# Patient Record
Sex: Female | Born: 1945 | Race: Black or African American | Hispanic: No | Marital: Single | Smoking: Never smoker
Health system: Southern US, Community
[De-identification: ages and names within clinical notes are randomized; demographics above are authoritative.]

## PROBLEM LIST (undated history)

## (undated) DIAGNOSIS — I10 Essential (primary) hypertension: Secondary | ICD-10-CM

## (undated) DIAGNOSIS — R51 Headache: Secondary | ICD-10-CM

## (undated) DIAGNOSIS — K219 Gastro-esophageal reflux disease without esophagitis: Secondary | ICD-10-CM

## (undated) DIAGNOSIS — M1990 Unspecified osteoarthritis, unspecified site: Secondary | ICD-10-CM

## (undated) HISTORY — PX: TONSILLECTOMY: SUR1361

## (undated) HISTORY — PX: ABDOMINAL HYSTERECTOMY: SHX81

---

## 2011-11-26 ENCOUNTER — Encounter (HOSPITAL_BASED_OUTPATIENT_CLINIC_OR_DEPARTMENT_OTHER): Payer: Self-pay | Admitting: Family Medicine

## 2011-11-26 ENCOUNTER — Emergency Department (HOSPITAL_BASED_OUTPATIENT_CLINIC_OR_DEPARTMENT_OTHER)
Admission: EM | Admit: 2011-11-26 | Discharge: 2011-11-26 | Disposition: A | Discharge status: Home / Self Care | Payer: BC Managed Care – PPO | Attending: Emergency Medicine | Admitting: Emergency Medicine

## 2011-11-26 ENCOUNTER — Emergency Department (HOSPITAL_BASED_OUTPATIENT_CLINIC_OR_DEPARTMENT_OTHER): Payer: BC Managed Care – PPO

## 2011-11-26 DIAGNOSIS — M79609 Pain in unspecified limb: Secondary | ICD-10-CM | POA: Insufficient documentation

## 2011-11-26 DIAGNOSIS — M7989 Other specified soft tissue disorders: Secondary | ICD-10-CM | POA: Insufficient documentation

## 2011-11-26 DIAGNOSIS — I10 Essential (primary) hypertension: Secondary | ICD-10-CM | POA: Insufficient documentation

## 2011-11-26 DIAGNOSIS — M79606 Pain in leg, unspecified: Secondary | ICD-10-CM

## 2011-11-26 HISTORY — DX: Essential (primary) hypertension: I10

## 2011-11-26 LAB — BASIC METABOLIC PANEL
BUN: 15 mg/dL (ref 6–23)
CO2: 28 mEq/L (ref 19–32)
Chloride: 103 mEq/L (ref 96–112)
Glucose, Bld: 100 mg/dL — ABNORMAL HIGH (ref 70–99)
Potassium: 3.4 mEq/L — ABNORMAL LOW (ref 3.5–5.1)

## 2011-11-26 MED ORDER — HYDROCODONE-ACETAMINOPHEN 5-500 MG PO TABS: 1.0000 | ORAL_TABLET | Freq: Four times a day (QID) | ORAL | Status: AC | PRN

## 2011-11-26 NOTE — ED Notes (Signed)
Brought urine to lab for hold

## 2011-11-26 NOTE — ED Provider Notes (Signed)
Medical screening examination/treatment/procedure(s) were performed by non-physician practitioner and as supervising physician I was immediately available for consultation/collaboration.  Ethelda Chick, MD 11/26/11 [phone]

## 2011-11-26 NOTE — ED Notes (Addendum)
Pt c/o right lower leg pain "for a year at least". Pt sts she saw PCP about same problem a year ago and had multiple tests. Pt sts pain is worse. Pt ambulatory. Pt drove self to ED.

## 2011-11-26 NOTE — Discharge Instructions (Signed)
Intermittent Claudication Blockage of leg arteries results from poor circulation of blood in the leg arteries. This produces an aching, tired, and sometimes burning pain in the legs that is brought on by exercise and made better by rest. Claudication refers to the limping that happens from leg cramps. It is also referred to as Vaso-occlusive disease of the legs, arterial insufficiency of the legs, recurrent leg pain, recurrent leg cramping and calf pain with exercise.  CAUSES  This condition is due to narrowing or blockage of the arteries (muscular vessels which carry blood away from the heart and around the body). Blockage of arteries can occur anywhere in the body. If they occur in the heart, a person may experience angina (chest pain) or even a heart attack. If they occur in the neck or the brain, a person may have a stroke. Intermittent claudication is when the blockage occurs in the legs, most commonly in the calf or the foot.   Atherosclerosis, or blockage of arteries, can occur for many reasons. Some of these are smoking, diabetes, and high cholesterol.  SYMPTOMS  Intermittent claudication may occur in both legs, and it often continues to get worse over time. However, some people complain only of weakness in the legs when walking, or a feeling of "tiredness" in the buttocks. Impotence (not able to have an erection) is an occasional complaint in men. Pain while resting is uncommon.  WHAT TO EXPECT AT [facility] PROVIDER'S OFFICE: Your medical history will be asked for and a physical examination will be performed. Medical history questions documenting claudication in detail may include:   Time pattern   Do you have leg cramps at night (nocturnal cramps)?   How often does leg pain with cramping occur?   Is it getting worse?   What is the quality of the pain?   Is the pain sharp?   Is there an aching pain with the cramps?   Aggravating factors   Is it worse after you exercise?     Is it worse after you are standing for a while?   Do you smoke? How much?   Do you drink alcohol? How much?   Are you diabetic? How well is your blood sugar controlled?   Other   What other symptoms are also present?   Has there been impotence (men)?   Is there pain in the back?   Is there a darkening of the skin of the legs, feet or toes?   Is there weakness or paralysis of the legs?  The physical examination may include evaluation of the femoral pulse (in the groin) and the other areas where the pulse can be felt in the legs. DIAGNOSIS  Diagnostic tests that may be performed include:  Blood pressure measured in arms and legs for comparison.   Doppler ultrasonography on the legs and the heart.   Duplex Doppler/ultrasound exam of extremity to visualize arterial blood flow.   ECG- to evaluate the activity of your heart.   Aortography- to visualize blockages in your arteries.  TREATMENT Surgical treatment may be suggested if claudication interferes with the patient's activities or work, and if the diseased arteries do not seem to be improving after treatment. Be aware that this condition can worsen over time and you should carefully monitor your condition. HOME CARE INSTRUCTIONS  Talk to your caregiver about the cause of your leg cramping and about what to do at home to relieve it.   A healthy diet is important to lessen  the likeliness of atherosclerosis.   A program of daily walking for short periods, and stopping for pain or cramping, may help improve function.   It is important to stop smoking.   Avoid putting hot or cold items on legs.   Avoid tight shoes.  SEEK MEDICAL CARE IF: There are many other causes of leg pain such as arthritis or low blood potassium. However, some causes of leg pain may be life threatening such as a blood clot in the legs. Seek medical attention if you have:  Leg pain that does not go away.   Legs that may be red, hot or swollen.    Ulcers or sores appear on your ankle or foot.   Any chest pain or shortness of breath accompanying leg pain.   Diabetes.   You are pregnant.  SEEK IMMEDIATE MEDICAL CARE IF:   Your leg pain becomes severe or will not go away.   Your foot turns blue or a dark color.   Your leg becomes red, hot or swollen or you develop a fever over 102F.   Any chest pain or shortness of breath accompanying leg pain.  MAKE SURE YOU:   Understand these instructions.   Will watch your condition.   Will get help right away if you are not doing well or get worse.  Document Released: 03/16/2004 Document Revised: 05/03/2011 Document Reviewed: 01/02/2008 [facility] Patient Information 2012 [place], [place].

## 2011-11-26 NOTE — ED Provider Notes (Signed)
History     CSN: [id number]  Arrival date & time 11/26/11  1553   First MD Initiated Contact with Patient 11/26/11 1616      Chief Complaint  Patient presents with  . Leg Pain    (Consider location/radiation/quality/duration/timing/severity/associated sxs/prior treatment) HPI Comments: Pt states that the pain is worse with standing for long periods:pt states that a year ago she had a test where see had "things hooked up to her abdomen and legs and the study came back normal"pt denies injury to the area or back pain:pt states that she has not been seen for it since the study:pt state that she takes over the counter medications without relief:pt states that she feels that sometimes it is warm to touch  Patient is a 66 y.o. female presenting with leg pain. The history is provided by the patient. No language interpreter was used.  Leg Pain  The incident occurred more than 1 week ago. There was no injury mechanism. Pain location: right lower leg. The quality of the pain is described as aching. The pain is mild. The pain has been constant since onset. Pertinent negatives include no numbness, no inability to bear weight, no loss of motion and no muscle weakness. She reports no foreign bodies present.    Past Medical History  Diagnosis Date  . Hypertension     History reviewed. No pertinent past surgical history.  No family history on file.  History  Substance Use Topics  . Smoking status: Never Smoker   . Smokeless tobacco: Not on file  . Alcohol Use: No    OB History    Grav Para Term Preterm Abortions TAB SAB Ect Mult Living                  Review of Systems  Constitutional: Negative.   Respiratory: Negative.   Cardiovascular: Negative.   Neurological: Negative for numbness.    Allergies  Review of patient's allergies indicates no known allergies.  Home Medications   Current Outpatient Rx  Name Route Sig Dispense Refill  . CLONIDINE HCL 0.2 MG PO TABS Oral Take  0.2 mg by mouth 2 (two) times daily.      BP 220/96  Pulse 69  Temp 98.3 F (36.8 C) (Oral)  Resp 20  Ht 5\' 5"  (1.651 m)  Wt 190 lb (86.183 kg)  BMI 31.62 kg/m2  SpO2 99%  Physical Exam  Nursing note and vitals reviewed. Constitutional: She is oriented to person, place, and time. She appears well-developed and well-nourished.  HENT:  Head: Normocephalic and atraumatic.  Eyes: Conjunctivae and EOM are normal.  Neck: Neck supple.  Cardiovascular: Normal rate and regular rhythm.   Pulmonary/Chest: Effort normal and breath sounds normal.  Musculoskeletal: Normal range of motion.       Mild swelling noted noted to right ankle and foot:no redness or warmth noted:pulses intact  Neurological: She is alert and oriented to person, place, and time.  Skin: Skin is warm and dry.    ED Course  Procedures (including critical care time)  Labs Reviewed  BASIC METABOLIC PANEL - Abnormal; Notable for the following:    Potassium 3.4 (*)     Glucose, Bld 100 (*)     All other components within normal limits   US Venous Img Lower Unilateral Right  11/26/2011  *RADIOLOGY REPORT*  Clinical Data: Leg pain  RIGHT LOWER EXTREMITY VENOUS DUPLEX ULTRASOUND  Technique:  Gray-scale sonography with graded compression, as well as color Doppler and  duplex ultrasound were performed to evaluate the deep venous system of the lower extremity from the level of the common femoral vein through the popliteal and proximal calf veins. Spectral Doppler was utilized to evaluate flow at rest and with distal augmentation maneuvers.  Comparison:  None.  Findings:  Normal compressibility of the common femoral, superficial femoral, and popliteal veins is demonstrated, as well as the visualized proximal calf veins.  No filling defects to suggest DVT on grayscale or color Doppler imaging.  Doppler waveforms show normal direction of venous flow, normal respiratory phasicity and response to augmentation.  IMPRESSION: No evidence of  lower extremity deep vein thrombosis.  Original Report Authenticated By: Rosealee Albee, M.D.     1. Leg pain       MDM  No sign of ZOX:WRUE treat pt symptomatically and have follow up with pcp        Teressa Lower, NP 11/26/11 1810

## 2011-11-27 NOTE — ED Notes (Signed)
Patient called requesting referral to neuro.  Reviewed MD notes where pt stated she was to f/u with her pcp.  Pt states she was discharged from her pcp.  Discussed referral to Dr. Rodena Medin and Neuro.  Patient stated she will follow up with [facility] and if she can not get in to see them, she will call back for referrals.

## 2013-07-15 ENCOUNTER — Other Ambulatory Visit (HOSPITAL_COMMUNITY): Payer: Self-pay | Admitting: Orthopaedic Surgery

## 2013-07-16 ENCOUNTER — Encounter (HOSPITAL_COMMUNITY): Payer: Self-pay | Admitting: Pharmacy Technician

## 2013-07-20 ENCOUNTER — Encounter (HOSPITAL_COMMUNITY)
Admission: RE | Admit: 2013-07-20 | Discharge: 2013-07-20 | Disposition: A | Discharge status: Home / Self Care | Payer: Medicare HMO | Source: Ambulatory Visit | Attending: Orthopaedic Surgery | Admitting: Orthopaedic Surgery

## 2013-07-20 ENCOUNTER — Ambulatory Visit (HOSPITAL_COMMUNITY)
Admission: RE | Admit: 2013-07-20 | Discharge: 2013-07-20 | Disposition: A | Discharge status: Home / Self Care | Payer: Medicare HMO | Source: Ambulatory Visit | Attending: Anesthesiology | Admitting: Anesthesiology

## 2013-07-20 ENCOUNTER — Encounter (HOSPITAL_COMMUNITY): Payer: Self-pay

## 2013-07-20 DIAGNOSIS — I10 Essential (primary) hypertension: Secondary | ICD-10-CM | POA: Insufficient documentation

## 2013-07-20 DIAGNOSIS — Z01818 Encounter for other preprocedural examination: Secondary | ICD-10-CM | POA: Insufficient documentation

## 2013-07-20 DIAGNOSIS — Z01810 Encounter for preprocedural cardiovascular examination: Secondary | ICD-10-CM | POA: Insufficient documentation

## 2013-07-20 DIAGNOSIS — Z01812 Encounter for preprocedural laboratory examination: Secondary | ICD-10-CM | POA: Insufficient documentation

## 2013-07-20 HISTORY — DX: Gastro-esophageal reflux disease without esophagitis: K21.9

## 2013-07-20 HISTORY — DX: Unspecified osteoarthritis, unspecified site: M19.90

## 2013-07-20 LAB — URINALYSIS, ROUTINE W REFLEX MICROSCOPIC
Bilirubin Urine: NEGATIVE
Glucose, UA: NEGATIVE mg/dL
Hgb urine dipstick: NEGATIVE
KETONES UR: NEGATIVE mg/dL
LEUKOCYTES UA: NEGATIVE
NITRITE: NEGATIVE
PROTEIN: NEGATIVE mg/dL
Specific Gravity, Urine: 1.015 (ref 1.005–1.030)
UROBILINOGEN UA: 0.2 mg/dL (ref 0.0–1.0)
pH: 8 (ref 5.0–8.0)

## 2013-07-20 LAB — BASIC METABOLIC PANEL
BUN: 12 mg/dL (ref 6–23)
CALCIUM: 9.5 mg/dL (ref 8.4–10.5)
CO2: 33 mEq/L — ABNORMAL HIGH (ref 19–32)
CREATININE: 0.82 mg/dL (ref 0.50–1.10)
Chloride: 101 mEq/L (ref 96–112)
GFR calc Af Amer: 85 mL/min — ABNORMAL LOW (ref >90)
GFR, EST NON AFRICAN AMERICAN: 73 mL/min — AB (ref >90)
GLUCOSE: 96 mg/dL (ref 70–99)
POTASSIUM: 3.5 meq/L — AB (ref 3.7–5.3)
Sodium: 144 mEq/L (ref 137–147)

## 2013-07-20 LAB — CBC
HCT: 35.9 % — ABNORMAL LOW (ref 36.0–46.0)
HEMOGLOBIN: 11.8 g/dL — AB (ref 12.0–15.0)
MCH: 31.1 pg (ref 26.0–34.0)
MCHC: 32.9 g/dL (ref 30.0–36.0)
MCV: 94.7 fL (ref 78.0–100.0)
Platelets: 246 10*3/uL (ref 150–400)
RBC: 3.79 MIL/uL — ABNORMAL LOW (ref 3.87–5.11)
RDW: 13.2 % (ref 11.5–15.5)
WBC: 7.9 10*3/uL (ref 4.0–10.5)

## 2013-07-20 LAB — SURGICAL PCR SCREEN
MRSA, PCR: NEGATIVE
Staphylococcus aureus: NEGATIVE

## 2013-07-20 LAB — TYPE AND SCREEN
ABO/RH(D): O POS
Antibody Screen: NEGATIVE

## 2013-07-20 LAB — ABO/RH: ABO/RH(D): O POS

## 2013-07-20 NOTE — Progress Notes (Signed)
Patient would like to sign the blood consent day of surgery.  Even tho there is an Air traffic controller"attestation" note in the orders, patient would like to hear the pro's & con's of having to receive blood.  Da

## 2013-07-20 NOTE — Pre-Procedure Instructions (Signed)
Autumn Lopez  07/20/2013   Your procedure is scheduled on:  Tuesday, March 3rd   [facility]  2 * 3 at  12:15 PM  Call this number if you have problems the morning of surgery: [phone]   Remember:   Do not eat food or drink liquids after midnight Monday.   Take these medicines the morning of surgery with A SIP OF WATER: Norvasc, Atenolol, Omeprazole, Pain Medication as needed,    Do not wear jewelry, make-up or nail polish.  Do not wear lotions, powders, or perfumes. You may NOT wear deodorant.  Do not shave underarms & legs 48 hours prior to surgery.    Do not bring valuables to the hospital.  [facility] is not responsible for any belongings or valuables.               Contacts, dentures or bridgework may not be worn into surgery.  Leave suitcase in the car. After surgery it may be brought to your room.  For patients admitted to the hospital, discharge time is determined by your treatment team.    Name and phone number of your driver:    Special Instructions: "Preparing for Surgery"   Please read over the following fact sheets that you were given: Pain Booklet, Blood Transfusion Information and Surgical Site Infection Prevention

## 2013-07-21 ENCOUNTER — Other Ambulatory Visit (HOSPITAL_COMMUNITY): Payer: BC Managed Care – PPO

## 2013-07-27 MED ORDER — CEFAZOLIN SODIUM-DEXTROSE 2-3 GM-% IV SOLR
2.0000 g | INTRAVENOUS | Status: AC
  Administered 2013-07-28: 2 g via INTRAVENOUS
  Filled 2013-07-27: qty 50

## 2013-07-27 NOTE — Progress Notes (Signed)
Patient notified to arrive at 1000.

## 2013-07-28 ENCOUNTER — Inpatient Hospital Stay (HOSPITAL_COMMUNITY): Payer: Medicare HMO

## 2013-07-28 ENCOUNTER — Encounter (HOSPITAL_COMMUNITY): Payer: Medicare HMO | Admitting: Anesthesiology

## 2013-07-28 ENCOUNTER — Encounter (HOSPITAL_COMMUNITY): Payer: Self-pay | Admitting: *Deleted

## 2013-07-28 ENCOUNTER — Inpatient Hospital Stay (HOSPITAL_COMMUNITY): Payer: Medicare HMO | Admitting: Anesthesiology

## 2013-07-28 ENCOUNTER — Encounter (HOSPITAL_COMMUNITY)
Admission: RE | Disposition: A | Discharge status: Home Health | Payer: Self-pay | Source: Ambulatory Visit | Attending: Orthopaedic Surgery

## 2013-07-28 ENCOUNTER — Inpatient Hospital Stay (HOSPITAL_COMMUNITY)
Admission: RE | Admit: 2013-07-28 | Discharge: 2013-07-31 | DRG: 470 | Disposition: A | Discharge status: Home Health | Payer: Medicare HMO | Source: Ambulatory Visit | Attending: Orthopaedic Surgery | Admitting: Orthopaedic Surgery

## 2013-07-28 DIAGNOSIS — M1610 Unilateral primary osteoarthritis, unspecified hip: Principal | ICD-10-CM | POA: Diagnosis present

## 2013-07-28 DIAGNOSIS — D62 Acute posthemorrhagic anemia: Secondary | ICD-10-CM | POA: Diagnosis not present

## 2013-07-28 DIAGNOSIS — M169 Osteoarthritis of hip, unspecified: Principal | ICD-10-CM | POA: Diagnosis present

## 2013-07-28 DIAGNOSIS — I10 Essential (primary) hypertension: Secondary | ICD-10-CM | POA: Diagnosis present

## 2013-07-28 DIAGNOSIS — M1612 Unilateral primary osteoarthritis, left hip: Secondary | ICD-10-CM

## 2013-07-28 DIAGNOSIS — Z96649 Presence of unspecified artificial hip joint: Secondary | ICD-10-CM

## 2013-07-28 DIAGNOSIS — K219 Gastro-esophageal reflux disease without esophagitis: Secondary | ICD-10-CM | POA: Diagnosis present

## 2013-07-28 DIAGNOSIS — Z7982 Long term (current) use of aspirin: Secondary | ICD-10-CM

## 2013-07-28 HISTORY — PX: TOTAL HIP ARTHROPLASTY: SHX124

## 2013-07-28 SURGERY — ARTHROPLASTY, HIP, TOTAL, ANTERIOR APPROACH
Anesthesia: General | Laterality: Left

## 2013-07-28 MED ORDER — NEOSTIGMINE METHYLSULFATE 1 MG/ML IJ SOLN
INTRAMUSCULAR | Status: AC
  Filled 2013-07-28: qty 10

## 2013-07-28 MED ORDER — EPHEDRINE SULFATE 50 MG/ML IJ SOLN
INTRAMUSCULAR | Status: AC
  Filled 2013-07-28: qty 1

## 2013-07-28 MED ORDER — EPHEDRINE SULFATE 50 MG/ML IJ SOLN
INTRAMUSCULAR | Status: DC | PRN
  Administered 2013-07-28: 10 mg via INTRAVENOUS
  Administered 2013-07-28: 15 mg via INTRAVENOUS

## 2013-07-28 MED ORDER — PHENOL 1.4 % MT LIQD: 1.0000 | OROMUCOSAL | Status: DC | PRN

## 2013-07-28 MED ORDER — ONDANSETRON HCL 4 MG/2ML IJ SOLN
INTRAMUSCULAR | Status: DC | PRN
  Administered 2013-07-28: 4 mg via INTRAVENOUS

## 2013-07-28 MED ORDER — GLYCOPYRROLATE 0.2 MG/ML IJ SOLN
INTRAMUSCULAR | Status: DC | PRN
  Administered 2013-07-28: 0.1 mg via INTRAVENOUS
  Administered 2013-07-28: .8 mg via INTRAVENOUS
  Administered 2013-07-28: 0.1 mg via INTRAVENOUS

## 2013-07-28 MED ORDER — MORPHINE SULFATE 15 MG PO TABS
30.0000 mg | ORAL_TABLET | ORAL | Status: DC | PRN
  Administered 2013-07-28 – 2013-07-30 (×6): 30 mg via ORAL
  Filled 2013-07-28 (×6): qty 2

## 2013-07-28 MED ORDER — OXYCODONE HCL 5 MG/5ML PO SOLN: 5.0000 mg | Freq: Once | ORAL | Status: DC | PRN

## 2013-07-28 MED ORDER — SODIUM CHLORIDE 0.9 % IV SOLN
INTRAVENOUS | Status: DC
  Administered 2013-07-28: 75 mL/h via INTRAVENOUS
  Administered 2013-07-29: 10 mL/h via INTRAVENOUS

## 2013-07-28 MED ORDER — CEFAZOLIN SODIUM 1-5 GM-% IV SOLN
1.0000 g | Freq: Four times a day (QID) | INTRAVENOUS | Status: AC
  Administered 2013-07-28 (×2): 1 g via INTRAVENOUS
  Filled 2013-07-28 (×2): qty 50

## 2013-07-28 MED ORDER — HYDROMORPHONE HCL PF 1 MG/ML IJ SOLN
1.0000 mg | INTRAMUSCULAR | Status: DC | PRN
  Administered 2013-07-28 – 2013-07-30 (×4): 1 mg via INTRAVENOUS
  Filled 2013-07-28 (×4): qty 1

## 2013-07-28 MED ORDER — PROPOFOL 10 MG/ML IV BOLUS
INTRAVENOUS | Status: AC
  Filled 2013-07-28: qty 20

## 2013-07-28 MED ORDER — DEXAMETHASONE SODIUM PHOSPHATE 4 MG/ML IJ SOLN
INTRAMUSCULAR | Status: AC
  Filled 2013-07-28: qty 2

## 2013-07-28 MED ORDER — PNEUMOCOCCAL VAC POLYVALENT 25 MCG/0.5ML IJ INJ
0.5000 mL | INJECTION | INTRAMUSCULAR | Status: AC
  Administered 2013-07-30: 0.5 mL via INTRAMUSCULAR
  Filled 2013-07-28: qty 0.5

## 2013-07-28 MED ORDER — GLYCOPYRROLATE 0.2 MG/ML IJ SOLN
INTRAMUSCULAR | Status: AC
  Filled 2013-07-28: qty 1

## 2013-07-28 MED ORDER — LISINOPRIL 40 MG PO TABS
40.0000 mg | ORAL_TABLET | Freq: Every day | ORAL | Status: DC
  Administered 2013-07-29 – 2013-07-31 (×3): 40 mg via ORAL
  Filled 2013-07-28 (×3): qty 1

## 2013-07-28 MED ORDER — ATENOLOL 50 MG PO TABS
50.0000 mg | ORAL_TABLET | Freq: Every day | ORAL | Status: DC
  Administered 2013-07-29 – 2013-07-31 (×3): 50 mg via ORAL
  Filled 2013-07-28 (×3): qty 1

## 2013-07-28 MED ORDER — METOCLOPRAMIDE HCL 5 MG/ML IJ SOLN
5.0000 mg | Freq: Three times a day (TID) | INTRAMUSCULAR | Status: DC | PRN
Start: 2013-07-28 — End: 2013-07-31

## 2013-07-28 MED ORDER — ALUM & MAG HYDROXIDE-SIMETH 200-200-20 MG/5ML PO SUSP: 30.0000 mL | ORAL | Status: DC | PRN

## 2013-07-28 MED ORDER — MIDAZOLAM HCL 2 MG/2ML IJ SOLN
INTRAMUSCULAR | Status: AC
  Filled 2013-07-28: qty 2

## 2013-07-28 MED ORDER — ASPIRIN EC 325 MG PO TBEC
325.0000 mg | DELAYED_RELEASE_TABLET | Freq: Two times a day (BID) | ORAL | Status: DC
  Administered 2013-07-29 – 2013-07-31 (×4): 325 mg via ORAL
  Filled 2013-07-28 (×8): qty 1

## 2013-07-28 MED ORDER — ROCURONIUM BROMIDE 100 MG/10ML IV SOLN
INTRAVENOUS | Status: DC | PRN
  Administered 2013-07-28: 50 mg via INTRAVENOUS

## 2013-07-28 MED ORDER — PHENYLEPHRINE 40 MCG/ML (10ML) SYRINGE FOR IV PUSH (FOR BLOOD PRESSURE SUPPORT)
PREFILLED_SYRINGE | INTRAVENOUS | Status: AC
  Filled 2013-07-28: qty 10

## 2013-07-28 MED ORDER — LIDOCAINE HCL (CARDIAC) 20 MG/ML IV SOLN
INTRAVENOUS | Status: AC
  Filled 2013-07-28: qty 5

## 2013-07-28 MED ORDER — GLYCOPYRROLATE 0.2 MG/ML IJ SOLN
INTRAMUSCULAR | Status: AC
  Filled 2013-07-28: qty 3

## 2013-07-28 MED ORDER — ACETAMINOPHEN 650 MG RE SUPP: 650.0000 mg | Freq: Four times a day (QID) | RECTAL | Status: DC | PRN

## 2013-07-28 MED ORDER — OXYCODONE HCL 5 MG PO TABS
5.0000 mg | ORAL_TABLET | ORAL | Status: DC | PRN
  Administered 2013-07-30 – 2013-07-31 (×5): 10 mg via ORAL
  Filled 2013-07-28 (×6): qty 2

## 2013-07-28 MED ORDER — SODIUM CHLORIDE 0.9 % IR SOLN
Status: DC | PRN
  Administered 2013-07-28 (×2): 1000 mL

## 2013-07-28 MED ORDER — ONDANSETRON HCL 4 MG PO TABS: 4.0000 mg | ORAL_TABLET | Freq: Four times a day (QID) | ORAL | Status: DC | PRN

## 2013-07-28 MED ORDER — TRANEXAMIC ACID 100 MG/ML IV SOLN
1000.0000 mg | INTRAVENOUS | Status: AC
  Administered 2013-07-28: 1000 mg via INTRAVENOUS
  Filled 2013-07-28: qty 10

## 2013-07-28 MED ORDER — INFLUENZA VAC SPLIT QUAD 0.5 ML IM SUSP
0.5000 mL | INTRAMUSCULAR | Status: AC
  Administered 2013-07-30: 0.5 mL via INTRAMUSCULAR
  Filled 2013-07-28: qty 0.5

## 2013-07-28 MED ORDER — DEXAMETHASONE SODIUM PHOSPHATE 4 MG/ML IJ SOLN
INTRAMUSCULAR | Status: DC | PRN
  Administered 2013-07-28: 8 mg via INTRAVENOUS

## 2013-07-28 MED ORDER — ONDANSETRON HCL 4 MG/2ML IJ SOLN: 4.0000 mg | Freq: Four times a day (QID) | INTRAMUSCULAR | Status: DC | PRN

## 2013-07-28 MED ORDER — STERILE WATER FOR INJECTION IJ SOLN
INTRAMUSCULAR | Status: AC
  Filled 2013-07-28: qty 10

## 2013-07-28 MED ORDER — MORPHINE SULFATE 30 MG PO TABS: 30.0000 mg | ORAL_TABLET | ORAL | Status: DC | PRN

## 2013-07-28 MED ORDER — OXYCODONE HCL 5 MG PO TABS: 5.0000 mg | ORAL_TABLET | Freq: Once | ORAL | Status: DC | PRN

## 2013-07-28 MED ORDER — ONDANSETRON HCL 4 MG/2ML IJ SOLN
INTRAMUSCULAR | Status: AC
  Filled 2013-07-28: qty 2

## 2013-07-28 MED ORDER — ARTIFICIAL TEARS OP OINT
TOPICAL_OINTMENT | OPHTHALMIC | Status: DC | PRN
  Administered 2013-07-28: 1 via OPHTHALMIC

## 2013-07-28 MED ORDER — FENTANYL CITRATE 0.05 MG/ML IJ SOLN
INTRAMUSCULAR | Status: AC
  Filled 2013-07-28: qty 5

## 2013-07-28 MED ORDER — HYDROMORPHONE HCL PF 1 MG/ML IJ SOLN
0.2500 mg | INTRAMUSCULAR | Status: DC | PRN
  Administered 2013-07-28 (×2): 0.5 mg via INTRAVENOUS

## 2013-07-28 MED ORDER — AMLODIPINE BESYLATE 10 MG PO TABS
10.0000 mg | ORAL_TABLET | Freq: Every day | ORAL | Status: DC
  Administered 2013-07-29 – 2013-07-31 (×3): 10 mg via ORAL
  Filled 2013-07-28 (×3): qty 1

## 2013-07-28 MED ORDER — PANTOPRAZOLE SODIUM 40 MG PO TBEC
40.0000 mg | DELAYED_RELEASE_TABLET | Freq: Every day | ORAL | Status: DC
  Administered 2013-07-29 – 2013-07-30 (×2): 40 mg via ORAL
  Filled 2013-07-28 (×2): qty 1

## 2013-07-28 MED ORDER — NEOSTIGMINE METHYLSULFATE 1 MG/ML IJ SOLN
INTRAMUSCULAR | Status: DC | PRN
  Administered 2013-07-28: 5 mg via INTRAVENOUS

## 2013-07-28 MED ORDER — LACTATED RINGERS IV SOLN
INTRAVENOUS | Status: DC | PRN
  Administered 2013-07-28 (×2): via INTRAVENOUS

## 2013-07-28 MED ORDER — FENTANYL CITRATE 0.05 MG/ML IJ SOLN
INTRAMUSCULAR | Status: DC | PRN
  Administered 2013-07-28: 25 ug via INTRAVENOUS
  Administered 2013-07-28 (×4): 50 ug via INTRAVENOUS
  Administered 2013-07-28: 25 ug via INTRAVENOUS

## 2013-07-28 MED ORDER — LIDOCAINE HCL (CARDIAC) 20 MG/ML IV SOLN
INTRAVENOUS | Status: DC | PRN
  Administered 2013-07-28: 100 mg via INTRAVENOUS

## 2013-07-28 MED ORDER — MENTHOL 3 MG MT LOZG
1.0000 | LOZENGE | OROMUCOSAL | Status: DC | PRN
  Administered 2013-07-28: 3 mg via ORAL
  Filled 2013-07-28 (×2): qty 9

## 2013-07-28 MED ORDER — ARTIFICIAL TEARS OP OINT
TOPICAL_OINTMENT | OPHTHALMIC | Status: AC
  Filled 2013-07-28: qty 3.5

## 2013-07-28 MED ORDER — LACTATED RINGERS IV SOLN
INTRAVENOUS | Status: DC
  Administered 2013-07-28: 11:00:00 via INTRAVENOUS

## 2013-07-28 MED ORDER — METOCLOPRAMIDE HCL 5 MG PO TABS
5.0000 mg | ORAL_TABLET | Freq: Three times a day (TID) | ORAL | Status: DC | PRN
  Filled 2013-07-28: qty 2

## 2013-07-28 MED ORDER — ZOLPIDEM TARTRATE 10 MG PO TABS: 5.0000 mg | ORAL_TABLET | Freq: Every day | ORAL | Status: DC

## 2013-07-28 MED ORDER — ZOLPIDEM TARTRATE 5 MG PO TABS
5.0000 mg | ORAL_TABLET | Freq: Every day | ORAL | Status: DC
  Administered 2013-07-28 – 2013-07-30 (×3): 5 mg via ORAL
  Filled 2013-07-28 (×3): qty 1

## 2013-07-28 MED ORDER — HYDROMORPHONE HCL PF 1 MG/ML IJ SOLN
INTRAMUSCULAR | Status: AC
  Filled 2013-07-28: qty 1

## 2013-07-28 MED ORDER — MIDAZOLAM HCL 5 MG/5ML IJ SOLN
INTRAMUSCULAR | Status: DC | PRN
  Administered 2013-07-28 (×2): 1 mg via INTRAVENOUS

## 2013-07-28 MED ORDER — ROCURONIUM BROMIDE 50 MG/5ML IV SOLN
INTRAVENOUS | Status: AC
  Filled 2013-07-28: qty 1

## 2013-07-28 MED ORDER — ACETAMINOPHEN 325 MG PO TABS: 650.0000 mg | ORAL_TABLET | Freq: Four times a day (QID) | ORAL | Status: DC | PRN

## 2013-07-28 MED ORDER — PROPOFOL 10 MG/ML IV BOLUS
INTRAVENOUS | Status: DC | PRN
  Administered 2013-07-28: 150 mg via INTRAVENOUS
  Administered 2013-07-28: 50 mg via INTRAVENOUS

## 2013-07-28 MED ORDER — METOCLOPRAMIDE HCL 5 MG/ML IJ SOLN: 10.0000 mg | Freq: Once | INTRAMUSCULAR | Status: DC | PRN

## 2013-07-28 SURGICAL SUPPLY — 53 items
BANDAGE GAUZE ELAST BULKY 4 IN (GAUZE/BANDAGES/DRESSINGS) IMPLANT
BENZOIN TINCTURE PRP APPL 2/3 (GAUZE/BANDAGES/DRESSINGS) ×3 IMPLANT
BLADE SAW SGTL 18X1.27X75 (BLADE) ×2 IMPLANT
BLADE SAW SGTL 18X1.27X75MM (BLADE) ×1
BLADE SURG ROTATE 9660 (MISCELLANEOUS) IMPLANT
CAPT HIP PF COP ×3 IMPLANT
CELLS DAT CNTRL 66122 CELL SVR (MISCELLANEOUS) ×1 IMPLANT
CLOSURE WOUND 1/2 X4 (GAUZE/BANDAGES/DRESSINGS) ×1
COVER BACK TABLE 24X17X13 BIG (DRAPES) IMPLANT
COVER SURGICAL LIGHT HANDLE (MISCELLANEOUS) ×3 IMPLANT
DERMABOND ADHESIVE PROPEN (GAUZE/BANDAGES/DRESSINGS) ×2
DERMABOND ADVANCED .7 DNX6 (GAUZE/BANDAGES/DRESSINGS) ×1 IMPLANT
DRAPE C-ARM 42X72 X-RAY (DRAPES) ×3 IMPLANT
DRAPE STERI IOBAN 125X83 (DRAPES) ×3 IMPLANT
DRAPE U-SHAPE 47X51 STRL (DRAPES) ×9 IMPLANT
DRSG AQUACEL AG ADV 3.5X10 (GAUZE/BANDAGES/DRESSINGS) ×3 IMPLANT
DURAPREP 26ML APPLICATOR (WOUND CARE) ×3 IMPLANT
ELECT BLADE 4.0 EZ CLEAN MEGAD (MISCELLANEOUS)
ELECT BLADE 6.5 EXT (BLADE) IMPLANT
ELECT CAUTERY BLADE 6.4 (BLADE) ×3 IMPLANT
ELECT REM PT RETURN 9FT ADLT (ELECTROSURGICAL) ×3
ELECTRODE BLDE 4.0 EZ CLN MEGD (MISCELLANEOUS) IMPLANT
ELECTRODE REM PT RTRN 9FT ADLT (ELECTROSURGICAL) ×1 IMPLANT
FACESHIELD LNG OPTICON STERILE (SAFETY) ×6 IMPLANT
GLOVE BIOGEL PI IND STRL 8 (GLOVE) ×2 IMPLANT
GLOVE BIOGEL PI INDICATOR 8 (GLOVE) ×4
GLOVE ECLIPSE 8.0 STRL XLNG CF (GLOVE) ×3 IMPLANT
GLOVE ORTHO TXT STRL SZ7.5 (GLOVE) ×6 IMPLANT
GOWN STRL REIN XL XLG (GOWN DISPOSABLE) ×6 IMPLANT
HANDPIECE INTERPULSE COAX TIP (DISPOSABLE) ×2
KIT BASIN OR (CUSTOM PROCEDURE TRAY) ×3 IMPLANT
KIT ROOM TURNOVER OR (KITS) ×3 IMPLANT
MANIFOLD NEPTUNE II (INSTRUMENTS) ×3 IMPLANT
NS IRRIG 1000ML POUR BTL (IV SOLUTION) ×3 IMPLANT
PACK TOTAL JOINT (CUSTOM PROCEDURE TRAY) ×3 IMPLANT
PAD ARMBOARD 7.5X6 YLW CONV (MISCELLANEOUS) ×6 IMPLANT
RTRCTR WOUND ALEXIS 18CM MED (MISCELLANEOUS) ×3
SET HNDPC FAN SPRY TIP SCT (DISPOSABLE) ×1 IMPLANT
SPONGE LAP 18X18 X RAY DECT (DISPOSABLE) IMPLANT
SPONGE LAP 4X18 X RAY DECT (DISPOSABLE) IMPLANT
STRIP CLOSURE SKIN 1/2X4 (GAUZE/BANDAGES/DRESSINGS) ×2 IMPLANT
SUT ETHIBOND NAB CT1 #1 30IN (SUTURE) ×3 IMPLANT
SUT MNCRL AB 4-0 PS2 18 (SUTURE) ×3 IMPLANT
SUT VIC AB 0 CT1 27 (SUTURE) ×2
SUT VIC AB 0 CT1 27XBRD ANBCTR (SUTURE) ×1 IMPLANT
SUT VIC AB 1 CT1 27 (SUTURE) ×2
SUT VIC AB 1 CT1 27XBRD ANBCTR (SUTURE) ×1 IMPLANT
SUT VIC AB 2-0 CT1 27 (SUTURE) ×4
SUT VIC AB 2-0 CT1 TAPERPNT 27 (SUTURE) ×2 IMPLANT
TOWEL OR 17X24 6PK STRL BLUE (TOWEL DISPOSABLE) ×3 IMPLANT
TOWEL OR 17X26 10 PK STRL BLUE (TOWEL DISPOSABLE) ×3 IMPLANT
TRAY FOLEY CATH 16FRSI W/METER (SET/KITS/TRAYS/PACK) IMPLANT
WATER STERILE IRR 1000ML POUR (IV SOLUTION) ×6 IMPLANT

## 2013-07-28 NOTE — Anesthesia Preprocedure Evaluation (Signed)
Anesthesia Evaluation  Patient identified by MRN, date of birth, ID band Patient awake    Reviewed: Allergy & Precautions, H&P , NPO status , Patient's Chart, lab work & pertinent test results, reviewed documented beta blocker date and time   Airway Mallampati: II TM Distance: >3 FB Neck ROM: full    Dental   Pulmonary neg pulmonary ROS,  breath sounds clear to auscultation        Cardiovascular hypertension, On Medications and On Home Beta Blockers Rhythm:regular     Neuro/Psych negative neurological ROS  negative psych ROS   GI/Hepatic Neg liver ROS, GERD-  Medicated and Controlled,  Endo/Other  negative endocrine ROS  Renal/GU negative Renal ROS  negative genitourinary   Musculoskeletal   Abdominal   Peds  Hematology negative hematology ROS (+)   Anesthesia Other Findings See surgeon's H&P   Reproductive/Obstetrics negative OB ROS                           Anesthesia Physical Anesthesia Plan  ASA: II  Anesthesia Plan: General   Post-op Pain Management:    Induction: Intravenous  Airway Management Planned: Oral ETT  Additional Equipment:   Intra-op Plan:   Post-operative Plan: Extubation in OR  Informed Consent: I have reviewed the patients History and Physical, chart, labs and discussed the procedure including the risks, benefits and alternatives for the proposed anesthesia with the patient or authorized representative who has indicated his/her understanding and acceptance.   Dental Advisory Given  Plan Discussed with: CRNA and Surgeon  Anesthesia Plan Comments:         Anesthesia Quick Evaluation

## 2013-07-28 NOTE — Anesthesia Postprocedure Evaluation (Signed)
Anesthesia Post Note  Patient: Autumn Lopez  Procedure(s) Performed: Procedure(s) (LRB): LEFT TOTAL HIP ARTHROPLASTY ANTERIOR APPROACH (Left)  Anesthesia type: General  Patient location: PACU  Post pain: Pain level controlled and Adequate analgesia  Post assessment: Post-op Vital signs reviewed, Patient's Cardiovascular Status Stable, Respiratory Function Stable, Patent Airway and Pain level controlled  Last Vitals:  Filed Vitals:   07/28/13 1411  BP:   Pulse:   Temp: 36.5 C  Resp:     Post vital signs: Reviewed and stable  Level of consciousness: awake, alert  and oriented  Complications: No apparent anesthesia complications

## 2013-07-28 NOTE — Anesthesia Procedure Notes (Addendum)
Procedure Name: Intubation Date/Time: 07/28/2013 12:18 PM Performed by: Gayla MedicusHYPES, Vilas Edgerly M. Pre-anesthesia Checklist: Patient identified, Emergency Drugs available, Suction available, Patient being monitored and Timeout performed Patient Re-evaluated:Patient Re-evaluated prior to inductionOxygen Delivery Method: Circle system utilized Preoxygenation: Pre-oxygenation with 100% oxygen Intubation Type: IV induction Ventilation: Mask ventilation without difficulty and Oral airway inserted - appropriate to patient size Laryngoscope Size: Mac and 3 Grade View: Grade II Tube type: Oral Tube size: 7.5 mm Number of attempts: 2 Airway Equipment and Method: Stylet and Video-laryngoscopy Placement Confirmation: ETT inserted through vocal cords under direct vision,  positive ETCO2 and breath sounds checked- equal and bilateral Secured at: 21 cm Tube secured with: Tape Dental Injury: Teeth and Oropharynx as per pre-operative assessment  Difficulty Due To: Difficulty was unanticipated Comments: DLx1 with Mac 3, grade III view, esophageal intubation. Second DL with glidescope, grade II view, intubation successful, +ETCO2, +BBSE. Both Intubation attempts atraumatic.

## 2013-07-28 NOTE — Brief Op Note (Signed)
07/28/2013  1:45 PM  PATIENT:  Autumn Lopez  68 y.o. female  PRE-OPERATIVE DIAGNOSIS:  Left hip osteoarthritis  POST-OPERATIVE DIAGNOSIS:  Left hip osteoarthritis  PROCEDURE:  Procedure(s): LEFT TOTAL HIP ARTHROPLASTY ANTERIOR APPROACH (Left)  SURGEON:  Surgeon(s) and Role:    * Kathryne Hitchhristopher Y Kalei Meda, MD - Primary  PHYSICIAN ASSISTANT: Rexene EdisonGil Clark, PA-C  ANESTHESIA:   general  EBL:  Total I/O In: 1300 [I.V.:1300] Out: 375 [Urine:175; Blood:200]  BLOOD ADMINISTERED:none  DRAINS: none   LOCAL MEDICATIONS USED:  NONE  SPECIMEN:  No Specimen  DISPOSITION OF SPECIMEN:  N/A  COUNTS:  YES  TOURNIQUET:  * No tourniquets in log *  DICTATION: .Other Dictation: Dictation Number [id number]  PLAN OF CARE: Admit to inpatient   PATIENT DISPOSITION:  PACU - hemodynamically stable.   Delay start of Pharmacological VTE agent (>24hrs) due to surgical blood loss or risk of bleeding: no

## 2013-07-28 NOTE — Preoperative (Signed)
Beta Blockers   Reason not to administer Beta Blockers:Not Applicable 

## 2013-07-28 NOTE — Plan of Care (Signed)
Problem: Consults Goal: Diagnosis- Total Joint Replacement Primary Total Hip Left     

## 2013-07-28 NOTE — Transfer of Care (Signed)
Immediate Anesthesia Transfer of Care Note  Patient: Autumn Lopez  Procedure(s) Performed: Procedure(s): LEFT TOTAL HIP ARTHROPLASTY ANTERIOR APPROACH (Left)  Patient Location: PACU  Anesthesia Type:General  Level of Consciousness: awake, alert  and oriented  Airway & Oxygen Therapy: Patient Spontanous Breathing and Patient connected to nasal cannula oxygen  Post-op Assessment: Report given to PACU RN and Post -op Vital signs reviewed and stable  Post vital signs: Reviewed and stable  Complications: No apparent anesthesia complications

## 2013-07-28 NOTE — H&P (Signed)
TOTAL HIP ADMISSION H&P  Patient is admitted for left total hip arthroplasty.  Subjective:  Chief Complaint: left hip pain  HPI: Autumn Lopez, 68 y.o. female, has a history of pain and functional disability in the left hip(s) due to arthritis and patient has failed non-surgical conservative treatments for greater than 12 weeks to include NSAID's and/or analgesics, corticosteriod injections, use of assistive devices, weight reduction as appropriate and activity modification.  Onset of symptoms was gradual starting 5 years ago with gradually worsening course since that time.The patient noted no past surgery on the left hip(s).  Patient currently rates pain in the left hip at 10 out of 10 with activity. Patient has night pain, worsening of pain with activity and weight bearing, trendelenberg gait, pain that interfers with activities of daily living and pain with passive range of motion. Patient has evidence of subchondral cysts, subchondral sclerosis, periarticular osteophytes and joint space narrowing by imaging studies. This condition presents safety issues increasing the risk of falls.  There is no current active infection.  Patient Active Problem List   Diagnosis Date Noted  . Arthritis of left hip 07/28/2013   Past Medical History  Diagnosis Date  . Hypertension   . GERD (gastroesophageal reflux disease)   . Arthritis     Past Surgical History  Procedure Laterality Date  . Abdominal hysterectomy    . Tonsillectomy      Prescriptions prior to admission  Medication Sig Dispense Refill  . amLODipine (NORVASC) 10 MG tablet Take 10 mg by mouth daily.      Marland Kitchen. aspirin EC 81 MG tablet Take 81 mg by mouth daily.      Marland Kitchen. atenolol (TENORMIN) 50 MG tablet Take 50 mg by mouth daily.      Marland Kitchen. lisinopril (PRINIVIL,ZESTRIL) 40 MG tablet Take 40 mg by mouth daily.      Marland Kitchen. morphine (MSIR) 30 MG tablet Take 30 mg by mouth every 4 (four) hours as needed for severe pain.      . naproxen sodium (ANAPROX)  220 MG tablet Take 440 mg by mouth 2 (two) times daily with a meal. Patient used this medication for her leg pain.      Marland Kitchen. omeprazole (PRILOSEC) 20 MG capsule Take 20 mg by mouth daily.      . Oxycodone HCl 10 MG TABS Take 10 mg by mouth every 8 (eight) hours as needed (for pain).      Marland Kitchen. trolamine salicylate (ASPERCREME) 10 % cream Apply 1 application topically as needed. Patient used this medication for her leg pain.      Marland Kitchen. zolpidem (AMBIEN) 10 MG tablet Take 10 mg by mouth at bedtime.       No Known Allergies  History  Substance Use Topics  . Smoking status: Never Smoker   . Smokeless tobacco: Not on file  . Alcohol Use: No    No family history on file.   Review of Systems  Musculoskeletal: Positive for joint pain.  All other systems reviewed and are negative.    Objective:  Physical Exam  Constitutional: She is oriented to person, place, and time. She appears well-developed and well-nourished.  HENT:  Head: Normocephalic and atraumatic.  Eyes: EOM are normal. Pupils are equal, round, and reactive to light.  Neck: Normal range of motion. Neck supple.  Cardiovascular: Normal rate and regular rhythm.   Respiratory: Effort normal and breath sounds normal.  GI: Soft. Bowel sounds are normal.  Musculoskeletal:  Left hip: She exhibits decreased range of motion, decreased strength, bony tenderness and crepitus.  Neurological: She is alert and oriented to person, place, and time.  Skin: Skin is warm and dry.  Psychiatric: She has a normal mood and affect.    Vital signs in last 24 hours:    Labs:   Estimated body mass index is 31.62 kg/(m^2) as calculated from the following:   Height as of 11/26/11: 5\' 5"  (1.651 m).   Weight as of 11/26/11: 86.183 kg (190 lb).   Imaging Review Plain radiographs demonstrate severe degenerative joint disease of the left hip(s). The bone quality appears to be good for age and reported activity level.  Assessment/Plan:  End stage  arthritis, left hip(s)  The patient history, physical examination, clinical judgement of the provider and imaging studies are consistent with end stage degenerative joint disease of the left hip(s) and total hip arthroplasty is deemed medically necessary. The treatment options including medical management, injection therapy, arthroscopy and arthroplasty were discussed at length. The risks and benefits of total hip arthroplasty were presented and reviewed. The risks due to aseptic loosening, infection, stiffness, dislocation/subluxation,  thromboembolic complications and other imponderables were discussed.  The patient acknowledged the explanation, agreed to proceed with the plan and consent was signed. Patient is being admitted for inpatient treatment for surgery, pain control, PT, OT, prophylactic antibiotics, VTE prophylaxis, progressive ambulation and ADL's and discharge planning.The patient is planning to be discharged to skilled nursing facility

## 2013-07-29 ENCOUNTER — Encounter (HOSPITAL_COMMUNITY): Payer: Self-pay | Admitting: Orthopaedic Surgery

## 2013-07-29 LAB — CBC
HEMATOCRIT: 28 % — AB (ref 36.0–46.0)
HEMOGLOBIN: 9.5 g/dL — AB (ref 12.0–15.0)
MCH: 31.6 pg (ref 26.0–34.0)
MCHC: 33.9 g/dL (ref 30.0–36.0)
MCV: 93 fL (ref 78.0–100.0)
Platelets: 211 10*3/uL (ref 150–400)
RBC: 3.01 MIL/uL — ABNORMAL LOW (ref 3.87–5.11)
RDW: 13 % (ref 11.5–15.5)
WBC: 11.1 10*3/uL — ABNORMAL HIGH (ref 4.0–10.5)

## 2013-07-29 LAB — BASIC METABOLIC PANEL
BUN: 18 mg/dL (ref 6–23)
CALCIUM: 8.3 mg/dL — AB (ref 8.4–10.5)
CO2: 26 mEq/L (ref 19–32)
Chloride: 102 mEq/L (ref 96–112)
Creatinine, Ser: 0.93 mg/dL (ref 0.50–1.10)
GFR calc Af Amer: 73 mL/min — ABNORMAL LOW (ref >90)
GFR calc non Af Amer: 63 mL/min — ABNORMAL LOW (ref >90)
GLUCOSE: 119 mg/dL — AB (ref 70–99)
Potassium: 3.6 mEq/L — ABNORMAL LOW (ref 3.7–5.3)
Sodium: 141 mEq/L (ref 137–147)

## 2013-07-29 MED ORDER — DOCUSATE SODIUM 100 MG PO CAPS
100.0000 mg | ORAL_CAPSULE | Freq: Two times a day (BID) | ORAL | Status: DC
  Administered 2013-07-29 – 2013-07-31 (×5): 100 mg via ORAL
  Filled 2013-07-29 (×7): qty 1

## 2013-07-29 MED ORDER — POLYETHYLENE GLYCOL 3350 17 G PO PACK: 17.0000 g | PACK | Freq: Every day | ORAL | Status: DC | PRN

## 2013-07-29 NOTE — Progress Notes (Signed)
Physical Therapy Treatment Patient Details Name: Autumn Lopez MRN: [id number] DOB: February 21, 1946 Today's Date: 07/29/2013 Time: [id number] PT Time Calculation (min): 31 min  PT Assessment / Plan / Recommendation  History of Present Illness L THA   PT Comments   Moving well, ambulates slowly, but steadily; On track for dc home   Follow Up Recommendations  Home health PT;Supervision - Intermittent     Does the patient have the potential to tolerate intense rehabilitation     Barriers to Discharge        Equipment Recommendations  Rolling walker with 5" wheels;3in1 (PT)    Recommendations for Other Services OT consult  Frequency 7X/week   Progress towards PT Goals    Plan Current plan remains appropriate    Precautions / Restrictions Precautions Precautions: None Restrictions Weight Bearing Restrictions: Yes LLE Weight Bearing: Weight bearing as tolerated   Pertinent Vitals/Pain 5/10 L hip pain patient repositioned for comfort     Mobility  Bed Mobility Overal bed mobility: Needs Assistance Bed Mobility: Supine to Sit Supine to sit: Mod assist General bed mobility comments: Cues for technqiue and mod assist to elevate trunk to sitting Transfers Overall transfer level: Needs assistance Equipment used: Rolling walker (2 wheeled) Transfers: Sit to/from Stand Sit to Stand: Min assist General transfer comment: Cues for safety, hand placement and technique Ambulation/Gait Ambulation/Gait assistance: Min guard Ambulation Distance (Feet): 120 Feet Assistive device: Rolling walker (2 wheeled) Gait Pattern/deviations: Step-to pattern;Step-through pattern General Gait Details: Cues for gait sequence and to unweigh painful LLE prn by bearing weight through hands    Exercises     PT Diagnosis:    PT Problem List:   PT Treatment Interventions:     PT Goals (current goals can now be found in the care plan section) Acute Rehab PT Goals Patient Stated Goal: walk without  pain PT Goal Formulation: With patient Time For Goal Achievement: 08/05/13 Potential to Achieve Goals: Good  Visit Information  Last PT Received On: 07/29/13 Assistance Needed: +1 History of Present Illness: L THA    Subjective Data  Subjective: Happy to walk again Patient Stated Goal: walk without pain   Cognition  Cognition Arousal/Alertness: Awake/alert Behavior During Therapy: WFL for tasks assessed/performed Overall Cognitive Status: Within Functional Limits for tasks assessed    Balance     End of Session PT - End of Session Activity Tolerance: Patient tolerated treatment well Patient left: in chair;with call bell/phone within reach;with family/visitor present Nurse Communication: Mobility status   GP     Autumn Lopez, Autumn Lopez 07/29/2013, 2:48 PM  Autumn Lopez, PT  Acute Rehabilitation Services Pager [phone] Office [phone]

## 2013-07-29 NOTE — Op Note (Signed)
Autumn Lopez, Autumn Lopez NO.:  [id number]  MEDICAL RECORD NO.:  [id number]  LOCATION:  5N31C                        FACILITY:  [facility]  PHYSICIAN:  Vanita Panda. Magnus Ivan, M.D.DATE OF BIRTH:  1946-01-08  DATE OF PROCEDURE:  07/28/2013 DATE OF DISCHARGE:                              OPERATIVE REPORT   PREOPERATIVE DIAGNOSES:  Severe end-stage arthritis and degenerative joint disease, left hip.  POSTOPERATIVE DIAGNOSES:  Severe end-stage arthritis and degenerative joint disease, left hip.  PROCEDURE:  Left total hip arthroplasty through direct anterior approach.  IMPLANTS:  DePuy Sector Gription acetabular component size 50, apex hole eliminator guide, size 32+ 4 neutral polyethylene liner, size 11 Corail femoral component with standard offset, size 32+ 1 ceramic hip ball.  SURGEON:  Vanita Panda. Magnus Ivan, M.D.  ASSISTING:  Richardean Canal, PA-C.  ANESTHESIA:  General.  ANTIBIOTICS:  2 g IV Ancef.  BLOOD LOSS:  250 mL.  COMPLICATIONS:  None.  INDICATIONS:  Autumn Lopez is a 68 year old female with debilitating arthritis involving both of her hips with the left being much worse than the right.  She has failed all modes of conservative treatment.  At this point, wished to proceed with a total hip arthroplasty.  I have explained the risks and benefits of this to her in great detail including the risk of acute blood loss anemia, nerve and vessel injury, fracture, infection, dislocation, and DVT.  She understands the goals are decreased pain, improved mobility, and overall improved quality of life.  PROCEDURE DESCRIPTION:  After informed consent was obtained, appropriate left hip was marked.  She was brought to the operating room.  General anesthesia was obtained while she was on her stretcher.  A Foley catheter was placed and traction boots were placed on both of her feet. She was next placed supine on the Hana fracture table with the perineal post in  place and both legs in inline skeletal traction devices, but no traction applied.  Her left operative hip was then prepped and draped with DuraPrep and sterile drapes.  A time-out was called to identify correct patient.  She was identified as the correct patient, correct left hip.  I then made an incision inferior and posterior to the anterosuperior iliac spine and carried this obliquely down the leg.  I dissected down to the tensor fascia lata muscle and the tensor fascia was divided longitudinally, so I could proceed with a direct anterior approach to the hip.  I cauterized the lateral femoral circumflex vessels and then put a cobra retractors around the medial neck and the lateral neck of the femoral of the femur.  I opened up the hip capsule in an L-type format finding a large joint effusion and placing the hip retractors within the hip joint capsule around the femoral neck.  I then made my femoral neck cut with the oscillating saw just proximal to the lesser trochanter and completed this with an osteotome.  I placed a corkscrew guide in the femoral head and removed the femoral head in its entirety.  I then cleaned the acetabulum of debris including releasing the transverse acetabular ligament.  I placed a bent Hohmann medially and a Cobra retractor laterally.  I then began reaming  from a size 42 reamer in 2 mm increments up to a size 49 with a 49 not being quite high.  All reamers were placed under direct visualization.  The last reamer under direct fluoroscopy, so I could obtain our depth of reaming, our inclination and anteversion.  Once I was pleased with this, I placed the real DePuy Sector Gription, acetabular component size 50.  With that in place under direct fluoroscopy,  I then placed also the apex hole eliminator guide and finally the 32+ 4 neutral polyethylene liner.  Once I was pleased with the position of the acetabular component, I turned attention to the femur with the  leg externally rotated to 100 degrees extended and adducted.  I was able to place a Mueller retractor medially and a Hohmann retractor behind the greater trochanter.  I released the lateral joint capsule and then used a box cutting osteotome to enter the femoral canal and a rongeur to lateralize.  I then began broaching using the Corail broaching system from DePuy from a size 8 up to a size 11. The 11 had a nice tight fit, filling the canal.  I trialed a standard neck and a 32+ 1 hip ball.  We brought the leg back over and up with traction and internal rotation, reduced in the pelvis and it was stable with a past 95 degrees of external rotation and 45 degrees of internal rotation.  Shuck was minimal and the leg lengths and offset were measured near equal under direct fluoroscopy.  I then dislocated the hip and removed the trial components and then placed the real Corail femoral component size 11 with standard offset and the real 32+ 1 ceramic hip ball and once again reduced in the pelvis and it was stable.  We then copiously irrigated the soft tissue with normal saline solution using pulsatile lavage.  We repaired the joint capsule with interrupted #1 Ethibond suture, followed by running #1 Vicryl and the tensor fascia, 0 Vicryl in the deep tissue, 2-0 Vicryl in the subcutaneous tissue, and 4- 0 Monocryl subcuticular stitch.  Steri-Strips were applied in a well- padded Aquacel dressing.  The patient was taken off the Hana table, awakened, extubated, and taken to the recovery room in stable condition. All final counts were correct.  There were no complications noted.  Of note, Richardean CanalGilbert Clark, PA-C was present during the entire case and his presence was crucial for facility in this case, tracking and layered closure of the wound.     Vanita Pandahristopher Y. Magnus IvanBlackman, M.D.     CYB/MEDQ  D:  07/28/2013  T:  07/29/2013  Job:  [id number]

## 2013-07-29 NOTE — Progress Notes (Signed)
Subjective: 1 Day Post-Op Procedure(s) (LRB): LEFT TOTAL HIP ARTHROPLASTY ANTERIOR APPROACH (Left) Patient reports pain as moderate.    Objective: Vital signs in last 24 hours: Temp:  [97.5 F (36.4 C)-99.9 F (37.7 C)] 99 F (37.2 C) (03/04 0539) Pulse Rate:  [52-75] 73 (03/04 0539) Resp:  [12-21] 16 (03/04 0539) BP: (111-157)/(66-94) 132/72 mmHg (03/04 0539) SpO2:  [92 %-100 %] 100 % (03/04 0539)  Intake/Output from previous day: 03/03 0701 - 03/04 0700 In: 2668.8 [P.O.:480; I.V.:2088.8; IV Piggyback:100] Out: 885 [Urine:685; Blood:200] Intake/Output this shift:     Recent Labs  07/29/13 0430  HGB 9.5*    Recent Labs  07/29/13 0430  WBC 11.1*  RBC 3.01*  HCT 28.0*  PLT 211    Recent Labs  07/29/13 0430  NA 141  K 3.6*  CL 102  CO2 26  BUN 18  CREATININE 0.93  GLUCOSE 119*  CALCIUM 8.3*   No results found for this basename: LABPT, INR,  in the last 72 hours  Left lower leg Sensation intact distally Intact pulses distally Dorsiflexion/Plantar flexion intact Incision: dressing C/D/I Compartment soft  Assessment/Plan: 1 Day Post-Op Procedure(s) (LRB): LEFT TOTAL HIP ARTHROPLASTY ANTERIOR APPROACH (Left) Up with therapy KVO IV Acute blood loss anemia secondary to surgery.Monitor for symptoms of anemia. Richardean CanalCLARK, Gaelyn Tukes 07/29/2013, 8:19 AM

## 2013-07-29 NOTE — Evaluation (Signed)
Physical Therapy Evaluation Patient Details Name: Autumn Lopez MRN: [id number] DOB: 1945-12-12 Today's Date: 07/29/2013 Time: 4098-1191 PT Time Calculation (min): 41 min  PT Assessment / Plan / Recommendation History of Present Illness  L THA  Clinical Impression  Pt is s/p THA resulting in the deficits listed below (see PT Problem List).  Pt will benefit from skilled PT to increase their independence and safety with mobility to allow discharge to the venue listed below.   Pt must be independent with mobility and ADLs prior to dc to daughter's home; This goal is quite achievable for Ms. Sons       PT Assessment  Patient needs continued PT services    Follow Up Recommendations  Home health PT;Supervision - Intermittent    Does the patient have the potential to tolerate intense rehabilitation      Barriers to Discharge        Equipment Recommendations  Rolling walker with 5" wheels;3in1 (PT)    Recommendations for Other Services OT consult   Frequency 7X/week    Precautions / Restrictions Precautions Precautions: None Restrictions LLE Weight Bearing: Weight bearing as tolerated   Pertinent Vitals/Pain 6/10 pain; RN provided medication to assist with pain control patient repositioned for comfort       Mobility  Bed Mobility Overal bed mobility: Needs Assistance Bed Mobility: Supine to Sit Supine to sit: Mod assist General bed mobility comments: Cues for technqiue and mod assist to elevate trunk to sitting Transfers Overall transfer level: Needs assistance Equipment used: Rolling walker (2 wheeled) Transfers: Sit to/from Stand Sit to Stand: Min assist General transfer comment: Cues for safety, hand placement and technique Ambulation/Gait Ambulation/Gait assistance: Min guard;Min assist Ambulation Distance (Feet): 60 Feet Assistive device: Rolling walker (2 wheeled) Gait Pattern/deviations: Step-to pattern;Step-through pattern (emerging  step-through) General Gait Details: Cues for gait sequence and to unweigh painful LLE prn by bearing weight through hands    Exercises Total Joint Exercises Quad Sets: AROM;Both;5 reps Gluteal Sets: AROM;Both;5 reps Heel Slides: AAROM;Left;5 reps Hip ABduction/ADduction: AAROM;Left;5 reps   PT Diagnosis: Difficulty walking;Acute pain  PT Problem List: Decreased strength;Decreased range of motion;Decreased activity tolerance;Decreased mobility;Decreased knowledge of use of DME;Pain PT Treatment Interventions: DME instruction;Gait training;Stair training;Functional mobility training;Therapeutic activities;Therapeutic exercise;Patient/family education     PT Goals(Current goals can be found in the care plan section) Acute Rehab PT Goals Patient Stated Goal: walk without pain PT Goal Formulation: With patient Time For Goal Achievement: 08/05/13 Potential to Achieve Goals: Good  Visit Information  Last PT Received On: 07/29/13 Assistance Needed: +1 History of Present Illness: L THA       Prior Functioning  Home Living Family/patient expects to be discharged to:: Private residence (initially to daughter's home) Living Arrangements: Alone;Children Available Help at Discharge: Family;Available PRN/intermittently (Daughter/friends available most of day) Type of Home: House Home Access: Stairs to enter [employer] of Steps: 2 Entrance Stairs-Rails: None Home Layout: One level (daughter's home, own home bedroom upstairs) Home Equipment: None Prior Function Level of Independence: Independent Communication Communication: No difficulties    Cognition  Cognition Arousal/Alertness: Awake/alert Behavior During Therapy: WFL for tasks assessed/performed Overall Cognitive Status: Within Functional Limits for tasks assessed    Extremity/Trunk Assessment Upper Extremity Assessment Upper Extremity Assessment: Overall WFL for tasks assessed Lower Extremity Assessment Lower  Extremity Assessment: RLE deficits/detail RLE Deficits / Details: Decr AROM and strength, limited by pain postop   Balance    End of Session PT - End of Session Activity Tolerance: Patient tolerated  treatment well Patient left: in chair;with call bell/phone within reach;with family/visitor present Nurse Communication: Mobility status  GP     Van ClinesGarrigan, Cruzita Lipa Hamff Van ClinesHolly Sher Hellinger, [place]PT  Acute Rehabilitation Services Pager [phone] Office [phone]  07/29/2013, 9:52 AM

## 2013-07-29 NOTE — Evaluation (Signed)
Occupational Therapy Evaluation Patient Details Name: Autumn Lopez MRN: [id number] DOB: Sep 18, 1945 Today's Date: 07/29/2013 Time: 4098-1191 OT Time Calculation (min): 22 min  OT Assessment / Plan / Recommendation History of present illness L anterior THA.   Clinical Impression   Pt presents with below problem list. Pt independent with ADLs, PTA. Feel pt will benefit from acute OT to increase independence prior to d/c.     OT Assessment  Patient needs continued OT Services    Follow Up Recommendations  No OT follow up;Supervision - Intermittent    Barriers to Discharge      Equipment Recommendations  3 in 1 bedside comode    Recommendations for Other Services    Frequency  Min 2X/week    Precautions / Restrictions Precautions Precautions: None Restrictions Weight Bearing Restrictions: Yes LLE Weight Bearing: Weight bearing as tolerated   Pertinent Vitals/Pain Pain 5/10. Nurse came in and gave meds.     ADL  Upper Body Dressing: Set up Where Assessed - Upper Body Dressing: Supported sitting Lower Body Dressing: Minimal assistance Where Assessed - Lower Body Dressing: Supported sit to Pharmacist, hospital: Moderate assistance;Min guard Toilet Transfer Method: Sit to Barista: Regular height toilet;Raised toilet seat with arms (or 3-in-1 over toilet) Tub/Shower Transfer Method: Not assessed Equipment Used: Gait belt;Sock aid;Rolling walker Transfers/Ambulation Related to ADLs: Min guard for ambulation; Mod A for sit to stand from regular height commode and Min guard for sit <> stand to/from 3 in 1 ADL Comments: Educated on dressing technique and recommended to stand in front of chair/bed with walker in front when pulling up LB clothing. Educated on tub transfer techniques. Pt practiced with sockaid as she can't fully reach to don left sock. Recommended daughter pick up rugs in house and also discussed use of bag on walker with pt.     OT  Diagnosis: Acute pain  OT Problem List: Decreased strength;Decreased knowledge of use of DME or AE;Decreased knowledge of precautions;Pain;Decreased range of motion;Decreased activity tolerance OT Treatment Interventions: Self-care/ADL training;DME and/or AE instruction;Therapeutic activities;Patient/family education;Balance training   OT Goals(Current goals can be found in the care plan section) Acute Rehab OT Goals Patient Stated Goal: not stated OT Goal Formulation: With patient Time For Goal Achievement: 08/05/13 Potential to Achieve Goals: Good ADL Goals Pt Will Perform Lower Body Dressing: with modified independence;sit to/from stand Pt Will Transfer to Toilet: with modified independence;ambulating (3 in 1 over commode) Pt Will Perform Toileting - Clothing Manipulation and hygiene: with modified independence;sit to/from stand Pt Will Perform Tub/Shower Transfer: Tub transfer;with supervision;ambulating;3 in 1;rolling walker  Visit Information  Last OT Received On: 07/29/13 Assistance Needed: +1 History of Present Illness: L THA       Prior Functioning     Home Living Family/patient expects to be discharged to:: Private residence (initiallyto daughter's home) Living Arrangements: Alone;Children Available Help at Discharge: Family;Available PRN/intermittently (daughter/friends available most of the day) Type of Home: House Home Access: Stairs to enter [employer] of Steps: 2 Entrance Stairs-Rails: None Home Layout: One level (daughter's home, own home bedroom upstairs) Home Equipment: Grab bars - toilet Prior Function Level of Independence: Independent Communication Communication: No difficulties         Vision/Perception     Cognition  Cognition Arousal/Alertness: Awake/alert Behavior During Therapy: WFL for tasks assessed/performed Overall Cognitive Status: Within Functional Limits for tasks assessed    Extremity/Trunk Assessment Upper Extremity  Assessment Upper Extremity Assessment: Overall WFL for tasks assessed Lower Extremity Assessment Lower  Extremity Assessment: Defer to PT evaluation RLE Deficits / Details: Decr AROM and strength, limited by pain postop     Mobility  Transfers Overall transfer level: Needs assistance Equipment used: Rolling walker (2 wheeled) Transfers: Sit to/from Stand Sit to Stand: Min guard;Mod assist General transfer comment: Mod A for sit to stand from simulated regular height toilet. Min guard for sit <> stand to/from 3 in 1.        Balance     End of Session OT - End of Session Equipment Utilized During Treatment: Gait belt;Rolling walker Activity Tolerance: Patient tolerated treatment well Patient left: in chair;with call bell/phone within reach;with family/visitor present  GO     Earlie RavelingStraub, Elene Downum L OTR/L [phone] 07/29/2013, 11:00 AM

## 2013-07-30 ENCOUNTER — Encounter (HOSPITAL_COMMUNITY): Payer: Self-pay | Admitting: General Practice

## 2013-07-30 LAB — CBC
HEMATOCRIT: 26.9 % — AB (ref 36.0–46.0)
HEMOGLOBIN: 8.8 g/dL — AB (ref 12.0–15.0)
MCH: 30.8 pg (ref 26.0–34.0)
MCHC: 32.7 g/dL (ref 30.0–36.0)
MCV: 94.1 fL (ref 78.0–100.0)
Platelets: 175 10*3/uL (ref 150–400)
RBC: 2.86 MIL/uL — ABNORMAL LOW (ref 3.87–5.11)
RDW: 13.4 % (ref 11.5–15.5)
WBC: 9.3 10*3/uL (ref 4.0–10.5)

## 2013-07-30 MED ORDER — MORPHINE SULFATE 30 MG PO TABS: 30.0000 mg | ORAL_TABLET | ORAL | Status: DC | PRN

## 2013-07-30 MED ORDER — ASPIRIN 325 MG PO TBEC: 325.0000 mg | DELAYED_RELEASE_TABLET | Freq: Two times a day (BID) | ORAL | Status: AC

## 2013-07-30 MED ORDER — TIZANIDINE HCL 4 MG PO TABS: 4.0000 mg | ORAL_TABLET | Freq: Three times a day (TID) | ORAL | Status: DC | PRN

## 2013-07-30 MED ORDER — OXYCODONE HCL 10 MG PO TABS: 10.0000 mg | ORAL_TABLET | Freq: Three times a day (TID) | ORAL | Status: DC | PRN

## 2013-07-30 NOTE — Progress Notes (Signed)
Physical Therapy Treatment Note  Pt still fatigued during second session of therapy today; has not completed stair training but will see patient in AM to complete. Safely ambulating with Min guard but has difficulty advancing left leg due to weakness. Will continue to work with patient for stair training, strengthening, and independence with functional mobility.  Pain noted at 5-6/10 Nurse notified via telephone Repositioned in bed for comfort with family in room    07/30/13 1600  PT Visit Information  Last PT Received On 07/30/13  Assistance Needed +1  History of Present Illness L THA  PT Time Calculation  PT Start Time 1532  PT Stop Time 1552  PT Time Calculation (min) 20 min  Subjective Data  Subjective Pt states she was able to get a little rest this afternoon, however still feels tired. Requests that no stair training be done today but is willing to walk a short distance and perform exercises.  Precautions  Precautions None  Restrictions  Weight Bearing Restrictions Yes  LLE Weight Bearing WBAT  Cognition  Arousal/Alertness Awake/alert  Behavior During Therapy WFL for tasks assessed/performed  Overall Cognitive Status Within Functional Limits for tasks assessed  Bed Mobility  Overal bed mobility Needs Assistance  Bed Mobility Sit to Supine  Sit to supine Min assist  General bed mobility comments Min assist to lift legs into bed.  Transfers  Overall transfer level Needs assistance  Equipment used Rolling walker (2 wheeled)  Transfers Sit to/from Stand  Sit to Stand Min guard  General transfer comment No verbal cues needed for positioning. Min guard for safety, as she relies heavily on RW at initial standing  Ambulation/Gait  Ambulation/Gait assistance Min guard  Ambulation Distance (Feet) 25 Feet  Assistive device Rolling walker (2 wheeled)  Gait Pattern/deviations Step-to pattern;Decreased stance time - left  Gait velocity slowed  General Gait Details Pt with less  plantarflexion of R ankle to compensate clearance for L limb advancement. Verbal cues for patient to focus on weight shift R and hip flexion to clear foot during swing limb advance.  Exercises  Exercises Total Joint  Total Joint Exercises  Gluteal Sets AROM;Both;10 reps;Supine  Heel Slides AAROM;Left;10 reps;Supine  Hip ABduction/ADduction AAROM;Left;10 reps;Supine  Ankle Circles/Pumps AROM;Both;10 reps;Supine  Straight Leg Raises AAROM;Left;5 reps;Supine  Long Arc Quad AROM;Left;10 reps;Seated  Knee Flexion AROM;Left;10 reps;Seated  PT - End of Session  Equipment Utilized During Treatment Gait belt  Activity Tolerance Patient limited by fatigue  Patient left with call bell/phone within reach;in bed;with family/visitor present  Nurse Communication Patient requests pain meds  PT - Assessment/Plan  PT Plan Current plan remains appropriate  PT Frequency 7X/week  Recommendations for Other Services OT consult  Follow Up Recommendations Home health PT;Supervision - Intermittent  PT equipment Rolling walker with 5" wheels;3in1 (PT)  PT Goal Progression  Progress towards PT goals Progressing toward goals  Acute Rehab PT Goals  PT Goal Formulation With patient  Time For Goal Achievement 08/05/13  Potential to Achieve Goals Good  PT General Charges  $$ ACUTE PT VISIT 1 Procedure  PT Treatments  $Therapeutic Exercise 8-22 mins    [address] Oak DriveLogan Secor MansuraBarbour, [place]PT [phone]

## 2013-07-30 NOTE — Progress Notes (Signed)
Physical Therapy Treatment Patient Details Name: Autumn Lopez MRN: [id number] DOB: 22-Aug-1945 Today'Lopez Date: 07/30/2013 Time: [id number] PT Time Calculation (min): 24 min  PT Assessment / Plan / Recommendation  History of Present Illness L THA   PT Comments   Pt was unable to ambulate same distance this morning as she covered yesterday, and she states this is due to fatigue and increased pain. Pt is however ambulating safely with Min guard and extra time. Continues to need PT for increasing gait endurance and mechanics, strength, and education for home exercises. Will see patient again this PM after she has some rest.   Follow Up Recommendations  Home health PT;Supervision - Intermittent     Does the patient have the potential to tolerate intense rehabilitation     Barriers to Discharge        Equipment Recommendations  Rolling walker with 5" wheels;3in1 (PT)    Recommendations for Other Services OT consult  Frequency 7X/week   Progress towards PT Goals Progress towards PT goals: Progressing toward goals  Plan Current plan remains appropriate    Precautions / Restrictions Precautions Precautions: None Restrictions Weight Bearing Restrictions: Yes LLE Weight Bearing: Weight bearing as tolerated   Pertinent Vitals/Pain Reports 5/10 pain Nurse notified via telephone Repositioned in reclining chair for comfort.    Mobility  Bed Mobility Overal bed mobility: Needs Assistance Bed Mobility: Supine to Sit Supine to sit: Min assist General bed mobility comments: min assist for left leg in supine to sit Transfers Overall transfer level: Needs assistance Equipment used: Rolling walker (2 wheeled) Transfers: Sit to/from Stand Sit to Stand: Min guard General transfer comment: Pt correctly positions foot for comfort and uses stable surface (chair) to push up. Verbal cues to reach back with stand>sit. Ambulation/Gait Ambulation/Gait assistance: Min guard Ambulation Distance  (Feet): 80 Feet Assistive device: Rolling walker (2 wheeled) Gait Pattern/deviations: Step-to pattern;Decreased stance time - left;Decreased dorsiflexion - left Gait velocity: slowed General Gait Details: Pt plantarflexing with R ankle to clear and advance LLE. Demonstrates weakness with hip flexors. Verbal and tactile cues to increase hip flexion and extension during gait cycle on L. Pt began to show improved response to cues but diminished again with fatigue towards end of session. Decreased dorsiflexion on L with gait, however is able to demonstrate this ability while standing statically.    Exercises     PT Diagnosis:    PT Problem List:   PT Treatment Interventions:     PT Goals (current goals can now be found in the care plan section) Acute Rehab PT Goals Patient Stated Goal: not stated PT Goal Formulation: With patient Time For Goal Achievement: 08/05/13 Potential to Achieve Goals: Good  Visit Information  Last PT Received On: 07/30/13 Assistance Needed: +1 History of Present Illness: L THA    Subjective Data  Subjective: "Feeling tired today, didn't sleep well" Patient Stated Goal: not stated   Cognition  Cognition Arousal/Alertness: Awake/alert Behavior During Therapy: WFL for tasks assessed/performed Overall Cognitive Status: Within Functional Limits for tasks assessed    Balance     End of Session PT - End of Session Equipment Utilized During Treatment: Gait belt Activity Tolerance: Patient limited by fatigue Patient left: in chair;with call bell/phone within reach Nurse Communication: Patient requests pain meds   Autumn Lopez    Autumn Lopez, [place] [phone]  Berton Mount[place]bour, Autumn Lopez 07/30/2013, 11:15 AM

## 2013-07-30 NOTE — Discharge Instructions (Signed)
Increase your activities as comfort allows. You can get your current dressing wet in the shower. You can remove your current dressing in one week and then get your actual incision wet in the shower. Expect left thigh, leg, and foot swelling. Do get an over-the-counter stool softener to take daily.

## 2013-07-30 NOTE — Progress Notes (Signed)
Subjective: 2 Days Post-Op Procedure(s) (LRB): LEFT TOTAL HIP ARTHROPLASTY ANTERIOR APPROACH (Left) Patient reports pain as moderate.  Making progress with therapy.  Acute blood loss anemia from surgery, but asymptomatic.  Objective: Vital signs in last 24 hours: Temp:  [98.1 F (36.7 C)-100.4 F (38 C)] 100.4 F (38 C) (03/05 0530) Pulse Rate:  [61-78] 78 (03/05 0530) Resp:  [15-16] 16 (03/05 0530) BP: (95-145)/(53-72) 112/53 mmHg (03/05 0530) SpO2:  [94 %-96 %] 94 % (03/05 0530)  Intake/Output from previous day: 03/04 0701 - 03/05 0700 In: 1680 [P.O.:1080; I.V.:600] Out: -  Intake/Output this shift:     Recent Labs  07/29/13 0430 07/30/13 0512  HGB 9.5* 8.8*    Recent Labs  07/29/13 0430 07/30/13 0512  WBC 11.1* 9.3  RBC 3.01* 2.86*  HCT 28.0* 26.9*  PLT 211 175    Recent Labs  07/29/13 0430  NA 141  K 3.6*  CL 102  CO2 26  BUN 18  CREATININE 0.93  GLUCOSE 119*  CALCIUM 8.3*   No results found for this basename: LABPT, INR,  in the last 72 hours  Sensation intact distally Intact pulses distally Dorsiflexion/Plantar flexion intact Incision: scant drainage  Assessment/Plan: 2 Days Post-Op Procedure(s) (LRB): LEFT TOTAL HIP ARTHROPLASTY ANTERIOR APPROACH (Left) Up with therapy Plan for discharge tomorrow  Kathryne HitchBLACKMAN,Jamita Mckelvin Y 07/30/2013, 7:06 AM

## 2013-07-30 NOTE — Progress Notes (Signed)
Occupational Therapy Treatment Patient Details Name: Angus PalmsHarriette Mankins MRN: [id number] DOB: 1945-10-10 Today's Date: 07/30/2013 Time: [id number] OT Time Calculation (min): 37 min  OT Assessment / Plan / Recommendation  History of present illness L THA   OT comments  Patient making good progress to date and tolerating session well.   Follow Up Recommendations  No OT follow up;Supervision - Intermittent    Barriers to Discharge       Equipment Recommendations  3 in 1 bedside comode    Recommendations for Other Services    Frequency Min 2X/week   Progress towards OT Goals Progress towards OT goals: Progressing toward goals  Plan Discharge plan remains appropriate    Precautions / Restrictions Precautions Precautions: None Restrictions Weight Bearing Restrictions: Yes LLE Weight Bearing: Weight bearing as tolerated   Pertinent Vitals/Pain Pt reporting no pain.    ADL  Grooming: Teeth care;Wash/dry hands;Set up;Supervision/safety Where Assessed - Grooming: Supported standing Lower Body Dressing: Modified independent Where Assessed - Lower Body Dressing: Supported sitting (socks) Toilet Transfer: Hydrographic surveyorMin guard Toilet Transfer Method: Sit to Bar[place] Toilet Transfer Equipment: Raised toilet seat with arms (or 3-in-1 over toilet) Toileting - Clothing Manipulation and Hygiene: Supervision/safety Where Assessed - Toileting Clothing Manipulation and Hygiene: Sit to stand from 3-in-1 or toilet Equipment Used: Gait belt;Sock aid;Rolling walker Transfers/Ambulation Related to ADLs: Min guard for ambulation; min guard for sit <>stand to 3 in 1 over commode ADL Comments: Educated on dressing technique. Discussed tub transfer techniques. Pt practiced with sock aid to don left sock. Educated on toilet transfer technique.    OT Diagnosis:    OT Problem List:   OT Treatment Interventions:     OT Goals(current goals can now be found in the care plan section) Acute Rehab OT Goals Patient  Stated Goal: not stated OT Goal Formulation: With patient Time For Goal Achievement: 08/05/13 Potential to Achieve Goals: Good ADL Goals Pt Will Perform Lower Body Dressing: with modified independence;sit to/from stand Pt Will Transfer to Toilet:  (( 3 in 1 over commode)) Pt Will Perform Toileting - Clothing Manipulation and hygiene: with modified independence;sit to/from stand Pt Will Perform Tub/Shower Transfer: Tub transfer;with supervision;ambulating;3 in 1;rolling walker  Visit Information  Last OT Received On: 07/30/13 Assistance Needed: +1 History of Present Illness: L THA    Subjective Data      Prior Functioning       Cognition  Cognition Arousal/Alertness: Awake/alert Behavior During Therapy: WFL for tasks assessed/performed Overall Cognitive Status: Within Functional Limits for tasks assessed    Mobility  Bed Mobility Overal bed mobility: Needs Assistance Bed Mobility: Supine to Sit Supine to sit: Min assist General bed mobility comments: min assist for left leg in supine to sit Transfers Overall transfer level: Needs assistance Equipment used: Rolling walker (2 wheeled) Transfers: Sit to/from Stand Sit to Stand: Min guard General transfer comment: min guard sit <> stand, cues for hand placement and keeping walker directly in front of her before sitting     Exercises      Balance    End of Session OT - End of Session Equipment Utilized During Treatment: Gait belt;Rolling walker Activity Tolerance: Patient tolerated treatment well Patient left: in chair;with call bell/phone within reach;with family/visitor present  GO     Raynald KempMathews, Winford Hehn H OTR/L 07/30/2013, 10:26 AM

## 2013-07-30 NOTE — Care Management Note (Signed)
CARE MANAGEMENT NOTE 07/30/2013  Patient:  Fennel,Terrie   Account Number:  [id number]  Date Initiated:  07/30/2013  Documentation initiated by:  Vance PeperBRADY,Latessa Tillis  Subjective/Objective Assessment:   68 yr old female s/p left total hip arthroplasty.     Action/Plan:   Case Manager spoke with patient concerning home health and DME needs at discharge. Choice offered. [facility]  Patient states she will go home with her daughter. CM will enter address when she gets it.   Anticipated DC Date:  07/31/2013   Anticipated DC Plan:  HOME W HOME HEALTH SERVICES      DC Planning Services  CM consult      [facility]  HOME HE[facility]  DURABLE MEDICAL EQUIPMENT   Choice offered to / List presented to:  C-1 Patient   DME arranged  [facility]  3-N-1      DME agency  [employer]     HH arranged  HH-2 PT      HH agency  [facility]   Status of service:  In process, will continue to follow

## 2013-07-31 LAB — CBC
HCT: 25.9 % — ABNORMAL LOW (ref 36.0–46.0)
Hemoglobin: 8.6 g/dL — ABNORMAL LOW (ref 12.0–15.0)
MCH: 31.3 pg (ref 26.0–34.0)
MCHC: 33.2 g/dL (ref 30.0–36.0)
MCV: 94.2 fL (ref 78.0–100.0)
PLATELETS: 161 10*3/uL (ref 150–400)
RBC: 2.75 MIL/uL — ABNORMAL LOW (ref 3.87–5.11)
RDW: 13.6 % (ref 11.5–15.5)
WBC: 8.4 10*3/uL (ref 4.0–10.5)

## 2013-07-31 MED ORDER — FERROUS SULFATE 325 (65 FE) MG PO TABS: 325.0000 mg | ORAL_TABLET | Freq: Three times a day (TID) | ORAL | Status: DC

## 2013-07-31 NOTE — Progress Notes (Signed)
Physical Therapy Treatment Patient Details Name: Angus PalmsHarriette Hoiland MRN: [id number] DOB: May 18, 1946 Today's Date: 07/31/2013 Time: [id number] PT Time Calculation (min): 30 min  PT Assessment / Plan / Recommendation  History of Present Illness L THA   PT Comments   Pt ambulates 120 feet requiring extra time and supervision for safety. Hip flexor weakness contributing to gait deviations described below. Did not complete stair training due to pt informing PT she will not have stairs at her sister's house when she discharges. Will continue to work on strengthening and endurance with gait training and home exercises until discharge.   Follow Up Recommendations  Home health PT;Supervision - Intermittent     Does the patient have the potential to tolerate intense rehabilitation     Barriers to Discharge        Equipment Recommendations  Rolling walker with 5" wheels;3in1 (PT)    Recommendations for Other Services OT consult  Frequency 7X/week   Progress towards PT Goals Progress towards PT goals: Progressing toward goals  Plan Current plan remains appropriate    Precautions / Restrictions Precautions Precautions: None Restrictions Weight Bearing Restrictions: Yes LLE Weight Bearing: Weight bearing as tolerated   Pertinent Vitals/Pain Pt 5/10 pain Nurse notified via telephone Repositioned in reclining chair for comfort.    Mobility  Bed Mobility Overal bed mobility: Needs Assistance Bed Mobility: Sit to Supine Supine to sit: Supervision General bed mobility comments: Supervision for safety. Able to bring legs off of bed without physical assist, verbal cues for sequencing. Transfers Overall transfer level: Needs assistance Equipment used: Rolling walker (2 wheeled) Transfers: Sit to/from Stand Sit to Stand: Min guard General transfer comment: Min guard for safety, continues to lean over RW upon initial stand Ambulation/Gait Ambulation/Gait assistance: Supervision Ambulation  Distance (Feet): 120 Feet Assistive device: Rolling walker (2 wheeled) Gait Pattern/deviations: Step-to pattern;Step-through pattern;Decreased step length - right Gait velocity: slowed General Gait Details: Improved L foot clearance today with greater ability to flex hip in swing phase, however still demonstrates siginificant weakness here compensating with R ankle plantarflexion. Gait training focused on increasing step through gait.    Exercises     PT Diagnosis:    PT Problem List:   PT Treatment Interventions:     PT Goals (current goals can now be found in the care plan section) Acute Rehab PT Goals PT Goal Formulation: With patient Time For Goal Achievement: 08/05/13 Potential to Achieve Goals: Good  Visit Information  Last PT Received On: 07/31/13 Assistance Needed: +1 History of Present Illness: L THA    Subjective Data  Subjective: States she does not need to practice stairs because she is going to her sisters house which does not have steps. Pt reports 5/10 pain but would like to get out of bed now and ask nurse for medication after working with therapy.   Cognition  Cognition Arousal/Alertness: Awake/alert Behavior During Therapy: WFL for tasks assessed/performed Overall Cognitive Status: Within Functional Limits for tasks assessed    Balance     End of Session PT - End of Session Equipment Utilized During Treatment: Gait belt Activity Tolerance: Patient limited by fatigue Patient left: with call bell/phone within reach;in bed;in chair;with family/visitor present Nurse Communication: Patient requests pain meds   GP    [facility] Walnut ParkBarbour, [place]PT [phone]  [place]erton MountBarbour, Alva Kuenzel S 07/31/2013, 9:54 AM

## 2013-07-31 NOTE — Care Management Note (Signed)
CARE MANAGEMENT NOTE 07/31/2013  Patient:  Mannan,Irelynd   Account Number:  [id number]  Date Initiated:  07/30/2013  Documentation initiated by:  Vance PeperBRADY,Zain Bingman  Subjective/Objective Assessment:   68 yr old female s/p left total hip arthroplasty.     Action/Plan:   Case Manager spoke with patient concerning home health and DME needs at discharge. Choice offered. [facility]  Patient states she will go home with her daughter. CM will enter address when she gets it.   Anticipated DC Date:  07/31/2013   Anticipated DC Plan:  HOME W HOME HEALTH SERVICES      DC Planning Services  CM consult      [facility] Choice  HOME HEALTH  DURABLE MEDICAL EQUIPMENT   Choice offered to / List presented to:  C-1 Patient   DME arranged  [facility]  3-N-1      DME agency  [employer]     HH arranged  HH-2 PT      HH agency  [facility]   Status of service:  Completed, signed off Medicare Important Message given?   (If response is "NO", the following Medicare IM given date fields will be blank) Date Medicare IM given:   Date Additional Medicare IM given:    Discharge Disposition:  HOME W HOME HEALTH SERVICES  Per UR Regulation:    If discussed at Long Length of Stay Meetings, dates discussed:    Comments:  07/31/13 Vance PeperSusan Creedence Heiss, RN BSN Case Manager patient [facility] : [address] [facility], [place], [place]

## 2013-07-31 NOTE — Progress Notes (Signed)
Subjective: 3 Days Post-Op Procedure(s) (LRB): LEFT TOTAL HIP ARTHROPLASTY ANTERIOR APPROACH (Left) Patient reports pain as moderate.  Hgb changed minimally from yesterday.  Still asymptomatic.  Wants to go home this afternoon.  Objective: Vital signs in last 24 hours: Temp:  [98.8 F (37.1 C)-100 F (37.8 C)] 99.8 F (37.7 C) (03/06 0442) Pulse Rate:  [67-79] 79 (03/06 0442) Resp:  [16-18] 16 (03/06 0442) BP: (103-120)/(51-56) 120/56 mmHg (03/06 0442) SpO2:  [96 %-99 %] 97 % (03/06 0442) Weight:  [78.926 kg (174 lb)] 78.926 kg (174 lb) (03/06 0500)  Intake/Output from previous day: 03/05 0701 - 03/06 0700 In: 840 [P.O.:840] Out: -  Intake/Output this shift:     Recent Labs  07/29/13 0430 07/30/13 0512 07/31/13 0545  HGB 9.5* 8.8* 8.6*    Recent Labs  07/30/13 0512 07/31/13 0545  WBC 9.3 8.4  RBC 2.86* 2.75*  HCT 26.9* 25.9*  PLT 175 161    Recent Labs  07/29/13 0430  NA 141  K 3.6*  CL 102  CO2 26  BUN 18  CREATININE 0.93  GLUCOSE 119*  CALCIUM 8.3*   No results found for this basename: LABPT, INR,  in the last 72 hours  Sensation intact distally Intact pulses distally Dorsiflexion/Plantar flexion intact Incision: no drainage  Assessment/Plan: 3 Days Post-Op Procedure(s) (LRB): LEFT TOTAL HIP ARTHROPLASTY ANTERIOR APPROACH (Left) Up with therapy Discharge home with home health  Kathryne HitchBLACKMAN,Akita Maxim Y 07/31/2013, 6:58 AM

## 2013-07-31 NOTE — Discharge Summary (Signed)
Patient ID: Autumn Lopez MRN: [id number] DOB/AGE: 1945/09/24 68 y.o.  Admit date: 07/28/2013 Discharge date: 07/31/2013  Admission Diagnoses:  Principal Problem:   Arthritis of left hip Active Problems:   Status post THR (total hip replacement)   Discharge Diagnoses:  Same  Past Medical History  Diagnosis Date  . Hypertension   . GERD (gastroesophageal reflux disease)   . Arthritis     "hips" (07/29/2013)    Surgeries: Procedure(s): LEFT TOTAL HIP ARTHROPLASTY ANTERIOR APPROACH on 07/28/2013   Consultants:    Discharged Condition: Improved  Hospital Course: Amoura Ransier is an 68 y.o. female who was admitted 07/28/2013 for operative treatment ofArthritis of left hip. Patient has severe unremitting pain that affects sleep, daily activities, and work/hobbies. After pre-op clearance the patient was taken to the operating room on 07/28/2013 and underwent  Procedure(s): LEFT TOTAL HIP ARTHROPLASTY ANTERIOR APPROACH.    Patient was given perioperative antibiotics: Anti-infectives   Start     Dose/Rate Route Frequency Ordered Stop   07/28/13 1700  ceFAZolin (ANCEF) IVPB 1 g/50 mL premix     1 g 100 mL/hr over 30 Minutes Intravenous Every 6 hours 07/28/13 1539 07/28/13 2255   07/28/13 0600  ceFAZolin (ANCEF) IVPB 2 g/50 mL premix     2 g 100 mL/hr over 30 Minutes Intravenous On call to O.R. 07/27/13 1242 07/28/13 1234       Patient was given sequential compression devices, early ambulation, and chemoprophylaxis to prevent DVT.  Patient benefited maximally from hospital stay and there were no complications.    Recent vital signs: Patient Vitals for the past 24 hrs:  BP Temp Temp src Pulse Resp SpO2 Height Weight  07/31/13 0500 - - - - - - 5' (1.524 m) 78.926 kg (174 lb)  07/31/13 0442 120/56 mmHg 99.8 F (37.7 C) Oral 79 16 97 % - -  07/31/13 0400 - - - - 18 98 % - -  07/30/13 2000 - - - - 16 99 % - -  07/30/13 1934 109/55 mmHg 98.8 F (37.1 C) Oral 67 16 96 % - -      Recent laboratory studies:  Recent Labs  07/29/13 0430 07/30/13 0512 07/31/13 0545  WBC 11.1* 9.3 8.4  HGB 9.5* 8.8* 8.6*  HCT 28.0* 26.9* 25.9*  PLT 211 175 161  NA 141  --   --   K 3.6*  --   --   CL 102  --   --   CO2 26  --   --   BUN 18  --   --   CREATININE 0.93  --   --   GLUCOSE 119*  --   --   CALCIUM 8.3*  --   --      Discharge Medications:     Medication List    STOP taking these medications       naproxen sodium 220 MG tablet  Commonly known as:  ANAPROX      TAKE these medications       amLODipine 10 MG tablet  Commonly known as:  NORVASC  Take 10 mg by mouth daily.     aspirin 325 MG EC tablet  Take 1 tablet (325 mg total) by mouth 2 (two) times daily after a meal.     atenolol 50 MG tablet  Commonly known as:  TENORMIN  Take 50 mg by mouth daily.     ferrous sulfate 325 (65 FE) MG tablet  Take 1 tablet (325 mg  total) by mouth 3 (three) times daily with meals.     lisinopril 40 MG tablet  Commonly known as:  PRINIVIL,ZESTRIL  Take 40 mg by mouth daily.     morphine 30 MG tablet  Commonly known as:  MSIR  Take 1 tablet (30 mg total) by mouth every 4 (four) hours as needed for severe pain.     omeprazole 20 MG capsule  Commonly known as:  PRILOSEC  Take 20 mg by mouth daily.     Oxycodone HCl 10 MG Tabs  Take 1 tablet (10 mg total) by mouth every 8 (eight) hours as needed (for pain).     tiZANidine 4 MG tablet  Commonly known as:  ZANAFLEX  Take 1 tablet (4 mg total) by mouth every 8 (eight) hours as needed for muscle spasms.     trolamine salicylate 10 % cream  Commonly known as:  ASPERCREME  Apply 1 application topically as needed. Patient used this medication for her leg pain.     zolpidem 10 MG tablet  Commonly known as:  AMBIEN  Take 10 mg by mouth at bedtime.        Diagnostic Studies: Dg Chest 2 View  07/20/2013   CLINICAL DATA:  Preoperative evaluation for hip replacement, history hypertension  EXAM: CHEST  2 VIEW   COMPARISON:  None  FINDINGS: Minimal enlargement of cardiac silhouette.  Mediastinal contours and pulmonary vascularity normal.  Lungs clear.  No pleural effusion or pneumothorax.  Bones unremarkable.  IMPRESSION: Minimal enlargement of cardiac silhouette.  No acute abnormalities.   Electronically Signed   By: Ulyses SouthwardMark  Boles M.D.   On: 07/20/2013 15:58   Dg Hip Operative Left  07/28/2013   CLINICAL DATA:  Left hip arthroplasty.  EXAM: OPERATIVE LEFT HIP  COMPARISON:  None.  FINDINGS: Images show resection of the left femoral head neck and placement of a left hip prosthesis. The femoral acetabular components are well-seated and aligned. No evidence of an operative complication.  IMPRESSION: Well-positioned left hip prosthesis as described.   Electronically Signed   By: Amie Portlandavid  Ormond M.D.   On: 07/28/2013 14:09   Dg Pelvis Portable  07/28/2013   CLINICAL DATA:  Postop left hip.  EXAM: PORTABLE PELVIS 1-2 VIEWS  COMPARISON:  Intraoperative images same day.  FINDINGS: There has been placement of a left total hip arthroplasty which is intact and normally located. There are moderate osteoarthritic changes of the right hip. There are degenerative changes of the spine. Several phleboliths are present over the pelvis.  IMPRESSION: Left total hip arthroplasty intact and normally located.  Moderate osteoarthritis of the right hip.   Electronically Signed   By: Elberta Fortisaniel  Boyle M.D.   On: 07/28/2013 15:17   Dg Hip Portable 1 View Left  07/28/2013   CLINICAL DATA:  Postop left hip.  EXAM: PORTABLE LEFT HIP - 1 VIEW  COMPARISON:  Pelvis and intraoperative hip films same day.  FINDINGS: Exam demonstrates a left total hip arthroplasty intact and normally located. Remainder of the exam is unchanged.  IMPRESSION: Left total hip arthroplasty intact.   Electronically Signed   By: Elberta Fortisaniel  Boyle M.D.   On: 07/28/2013 15:21    Disposition: 01-Home or Self Care      Discharge Orders   Future Orders Complete By Expires   Call MD /  Call 911  As directed    Comments:     If you experience chest pain or shortness of breath, CALL 911 and be transported  to the hospital emergency room.  If you develope a fever above 101 F, pus (white drainage) or increased drainage or redness at the wound, or calf pain, call your surgeon's office.   Constipation Prevention  As directed    Comments:     Drink plenty of fluids.  Prune juice may be helpful.  You may use a stool softener, such as Colace (over the counter) 100 mg twice a day.  Use MiraLax (over the counter) for constipation as needed.   Diet - low sodium heart healthy  As directed    Discharge patient  As directed    Increase activity slowly as tolerated  As directed       Follow-up Information   Follow up with Kathryne Hitch, MD. Schedule an appointment as soon as possible for a visit in 2 weeks.   Specialty:  Orthopedic Surgery   Contact information:   [address] [place] [address] [phone]        Signed: Kathryne Hitch 07/31/2013, 4:04 PM

## 2013-10-07 ENCOUNTER — Other Ambulatory Visit (HOSPITAL_COMMUNITY): Payer: Self-pay | Admitting: Orthopaedic Surgery

## 2013-11-05 ENCOUNTER — Encounter (HOSPITAL_COMMUNITY): Payer: Self-pay | Admitting: Pharmacy Technician

## 2013-11-09 ENCOUNTER — Encounter (HOSPITAL_COMMUNITY): Payer: Self-pay

## 2013-11-09 ENCOUNTER — Encounter (HOSPITAL_COMMUNITY)
Admission: RE | Admit: 2013-11-09 | Discharge: 2013-11-09 | Disposition: A | Discharge status: Home / Self Care | Payer: Medicare HMO | Source: Ambulatory Visit | Attending: Orthopaedic Surgery | Admitting: Orthopaedic Surgery

## 2013-11-09 DIAGNOSIS — Z01812 Encounter for preprocedural laboratory examination: Secondary | ICD-10-CM | POA: Insufficient documentation

## 2013-11-09 HISTORY — DX: Headache: R51

## 2013-11-09 LAB — CBC
HCT: 36.2 % (ref 36.0–46.0)
HEMOGLOBIN: 11.7 g/dL — AB (ref 12.0–15.0)
MCH: 29.8 pg (ref 26.0–34.0)
MCHC: 32.3 g/dL (ref 30.0–36.0)
MCV: 92.1 fL (ref 78.0–100.0)
PLATELETS: 232 10*3/uL (ref 150–400)
RBC: 3.93 MIL/uL (ref 3.87–5.11)
RDW: 14.3 % (ref 11.5–15.5)
WBC: 5.5 10*3/uL (ref 4.0–10.5)

## 2013-11-09 LAB — PROTIME-INR
INR: 1 (ref 0.00–1.49)
Prothrombin Time: 13 seconds (ref 11.6–15.2)

## 2013-11-09 LAB — URINALYSIS, ROUTINE W REFLEX MICROSCOPIC
Bilirubin Urine: NEGATIVE
GLUCOSE, UA: NEGATIVE mg/dL
Hgb urine dipstick: NEGATIVE
KETONES UR: NEGATIVE mg/dL
LEUKOCYTES UA: NEGATIVE
NITRITE: NEGATIVE
PROTEIN: NEGATIVE mg/dL
Specific Gravity, Urine: 1.019 (ref 1.005–1.030)
UROBILINOGEN UA: 0.2 mg/dL (ref 0.0–1.0)
pH: 7 (ref 5.0–8.0)

## 2013-11-09 LAB — TYPE AND SCREEN
ABO/RH(D): O POS
Antibody Screen: NEGATIVE

## 2013-11-09 LAB — BASIC METABOLIC PANEL
BUN: 15 mg/dL (ref 6–23)
CALCIUM: 9.7 mg/dL (ref 8.4–10.5)
CO2: 30 mEq/L (ref 19–32)
Chloride: 103 mEq/L (ref 96–112)
Creatinine, Ser: 0.76 mg/dL (ref 0.50–1.10)
GFR, EST NON AFRICAN AMERICAN: 87 mL/min — AB (ref >90)
Glucose, Bld: 97 mg/dL (ref 70–99)
POTASSIUM: 3.4 meq/L — AB (ref 3.7–5.3)
Sodium: 144 mEq/L (ref 137–147)

## 2013-11-09 LAB — APTT: aPTT: 32 seconds (ref 24–37)

## 2013-11-09 LAB — SURGICAL PCR SCREEN
MRSA, PCR: NEGATIVE
Staphylococcus aureus: NEGATIVE

## 2013-11-09 NOTE — Pre-Procedure Instructions (Signed)
Autumn Lopez  11/09/2013   Your procedure is scheduled on:  Tuesday, June 23rd  [facility] at 1045 AM.  Call this number if you have problems the morning of surgery: [phone]   Remember:   Do not eat food or drink liquids after midnight.   Take these medicines the morning of surgery with A SIP OF WATER: atenolol, norvasc, prilosec, oxycodone if needed, morphine if needed  Stop taking aspirin, OTC vitamins, NSAIDS (ibuprofen, advil, motrin) as of 11/10/2013   Do not wear jewelry, make-up or nail polish.  Do not wear lotions, powders, or perfumes. You may wear deodorant.  Do not shave 48 hours prior to surgery. Men may shave face and neck.  Do not bring valuables to the hospital.  [facility] not responsible  for any belongings or valuables.               Contacts, dentures or bridgework may not be worn into surgery.  Leave suitcase in the car. After surgery it may be brought to your room.  For patients admitted to the hospital, discharge time is determined by your  treatment team.   Please read over the following fact sheets that you were given: Pain Booklet, Coughing and Deep Breathing, Blood Transfusion Information, MRSA Information and Surgical Site Infection Prevention [place] - Preparing for Surgery  Before surgery, you can play an important role.  Because skin is not sterile, your skin needs to be as free of germs as possible.  You can reduce the number of germs on you skin by washing with CHG (chlorahexidine gluconate) soap before surgery.  CHG is an antiseptic cleaner which kills germs and bonds with the skin to continue killing germs even after washing.  Please DO NOT use if you have an allergy to CHG or antibacterial soaps.  If your skin becomes reddened/irritated stop using the CHG and inform your nurse when you arrive at Short Stay.  Do not shave (including legs and underarms) for at least 48 hours prior to the first CHG shower.   You may shave your face.  Please follow these instructions carefully:   1.  Shower with CHG Soap the night before surgery and the morning of Surgery.  2.  If you choose to wash your hair, wash your hair first as usual with your normal shampoo.  3.  After you shampoo, rinse your hair and body thoroughly to remove the shampoo.  4.  Use CHG as you would any other liquid soap.  You can apply CHG directly to the skin and wash gently with scrungie or a clean washcloth.  5.  Apply the CHG Soap to your body ONLY FROM THE NECK DOWN.  Do not use on open wounds or open sores.  Avoid contact with your eyes, ears, mouth and genitals (private parts).  Wash genitals (private parts) with your normal soap.  6.  Wash thoroughly, paying special attention to the area where your surgery will be performed.  7.  Thoroughly rinse your body with warm water from the neck down.  8.  DO NOT shower/wash with your normal soap after using and rinsing off the CHG Soap.  9.  Pat yourself dry with a clean towel.            10.  Wear clean pajamas.            11.  Place clean sheets on your bed the night of your first shower and  do not sleep with pets.  Day of Surgery  Do not apply any lotions/deoderants the morning of surgery.  Please wear clean clothes to the hospital/surgery center.

## 2013-11-09 NOTE — Progress Notes (Signed)
Primary - dr. Lynford Humphreykasme Does not have a cardiologist ekg in epic from feb 2015

## 2013-11-16 MED ORDER — CEFAZOLIN SODIUM-DEXTROSE 2-3 GM-% IV SOLR
2.0000 g | INTRAVENOUS | Status: AC
  Administered 2013-11-17: 2 g via INTRAVENOUS
  Filled 2013-11-16: qty 50

## 2013-11-17 ENCOUNTER — Inpatient Hospital Stay (HOSPITAL_COMMUNITY): Payer: Medicare HMO

## 2013-11-17 ENCOUNTER — Inpatient Hospital Stay (HOSPITAL_COMMUNITY): Payer: Medicare HMO | Admitting: Certified Registered Nurse Anesthetist

## 2013-11-17 ENCOUNTER — Encounter (HOSPITAL_COMMUNITY)
Admission: RE | Disposition: A | Discharge status: Home Health | Payer: Self-pay | Source: Ambulatory Visit | Attending: Orthopaedic Surgery

## 2013-11-17 ENCOUNTER — Encounter (HOSPITAL_COMMUNITY): Payer: Medicare HMO | Admitting: Certified Registered Nurse Anesthetist

## 2013-11-17 ENCOUNTER — Encounter (HOSPITAL_COMMUNITY): Payer: Self-pay | Admitting: *Deleted

## 2013-11-17 ENCOUNTER — Inpatient Hospital Stay (HOSPITAL_COMMUNITY)
Admission: RE | Admit: 2013-11-17 | Discharge: 2013-11-20 | DRG: 470 | Disposition: A | Discharge status: Home Health | Payer: Medicare HMO | Source: Ambulatory Visit | Attending: Orthopaedic Surgery | Admitting: Orthopaedic Surgery

## 2013-11-17 DIAGNOSIS — Z96649 Presence of unspecified artificial hip joint: Secondary | ICD-10-CM

## 2013-11-17 DIAGNOSIS — I10 Essential (primary) hypertension: Secondary | ICD-10-CM | POA: Diagnosis present

## 2013-11-17 DIAGNOSIS — K219 Gastro-esophageal reflux disease without esophagitis: Secondary | ICD-10-CM | POA: Diagnosis present

## 2013-11-17 DIAGNOSIS — M1610 Unilateral primary osteoarthritis, unspecified hip: Principal | ICD-10-CM | POA: Diagnosis present

## 2013-11-17 DIAGNOSIS — Z79899 Other long term (current) drug therapy: Secondary | ICD-10-CM

## 2013-11-17 DIAGNOSIS — Z7982 Long term (current) use of aspirin: Secondary | ICD-10-CM

## 2013-11-17 DIAGNOSIS — D62 Acute posthemorrhagic anemia: Secondary | ICD-10-CM | POA: Diagnosis not present

## 2013-11-17 DIAGNOSIS — M1611 Unilateral primary osteoarthritis, right hip: Secondary | ICD-10-CM

## 2013-11-17 DIAGNOSIS — M169 Osteoarthritis of hip, unspecified: Principal | ICD-10-CM | POA: Diagnosis present

## 2013-11-17 HISTORY — PX: TOTAL HIP ARTHROPLASTY: SHX124

## 2013-11-17 SURGERY — ARTHROPLASTY, HIP, TOTAL, ANTERIOR APPROACH
Anesthesia: General | Site: Hip | Laterality: Right

## 2013-11-17 MED ORDER — ACETAMINOPHEN 325 MG PO TABS
650.0000 mg | ORAL_TABLET | Freq: Four times a day (QID) | ORAL | Status: DC | PRN
Start: 2013-11-17 — End: 2013-11-20
  Administered 2013-11-19: 650 mg via ORAL
  Filled 2013-11-17: qty 2

## 2013-11-17 MED ORDER — ONDANSETRON HCL 4 MG/2ML IJ SOLN: 4.0000 mg | Freq: Four times a day (QID) | INTRAMUSCULAR | Status: DC | PRN

## 2013-11-17 MED ORDER — ZOLPIDEM TARTRATE 5 MG PO TABS
5.0000 mg | ORAL_TABLET | Freq: Every day | ORAL | Status: DC
  Administered 2013-11-17 – 2013-11-19 (×3): 5 mg via ORAL
  Filled 2013-11-17 (×3): qty 1

## 2013-11-17 MED ORDER — MIDAZOLAM HCL 2 MG/2ML IJ SOLN
INTRAMUSCULAR | Status: AC
  Filled 2013-11-17: qty 2

## 2013-11-17 MED ORDER — AMLODIPINE BESYLATE 10 MG PO TABS
10.0000 mg | ORAL_TABLET | Freq: Every day | ORAL | Status: DC
  Administered 2013-11-18 – 2013-11-20 (×2): 10 mg via ORAL
  Filled 2013-11-17 (×3): qty 1

## 2013-11-17 MED ORDER — LACTATED RINGERS IV SOLN
INTRAVENOUS | Status: DC | PRN
  Administered 2013-11-17: 13:00:00 via INTRAVENOUS

## 2013-11-17 MED ORDER — PHENOL 1.4 % MT LIQD: 1.0000 | OROMUCOSAL | Status: DC | PRN

## 2013-11-17 MED ORDER — OXYCODONE HCL 5 MG/5ML PO SOLN: 5.0000 mg | Freq: Once | ORAL | Status: DC | PRN

## 2013-11-17 MED ORDER — ONDANSETRON HCL 4 MG/2ML IJ SOLN: 4.0000 mg | Freq: Once | INTRAMUSCULAR | Status: DC | PRN

## 2013-11-17 MED ORDER — KETOROLAC TROMETHAMINE 30 MG/ML IJ SOLN: 15.0000 mg | Freq: Once | INTRAMUSCULAR | Status: DC | PRN

## 2013-11-17 MED ORDER — METHOCARBAMOL 500 MG PO TABS
500.0000 mg | ORAL_TABLET | Freq: Four times a day (QID) | ORAL | Status: DC | PRN
Start: 2013-11-17 — End: 2013-11-20
  Administered 2013-11-17 – 2013-11-19 (×5): 500 mg via ORAL
  Filled 2013-11-17 (×4): qty 1

## 2013-11-17 MED ORDER — DIPHENHYDRAMINE HCL 12.5 MG/5ML PO ELIX
12.5000 mg | ORAL_SOLUTION | ORAL | Status: DC | PRN
  Administered 2013-11-19 (×2): 25 mg via ORAL
  Filled 2013-11-17 (×2): qty 10

## 2013-11-17 MED ORDER — LIDOCAINE HCL (CARDIAC) 20 MG/ML IV SOLN
INTRAVENOUS | Status: AC
  Filled 2013-11-17: qty 5

## 2013-11-17 MED ORDER — MENTHOL 3 MG MT LOZG: 1.0000 | LOZENGE | OROMUCOSAL | Status: DC | PRN

## 2013-11-17 MED ORDER — SODIUM CHLORIDE 0.9 % IR SOLN
Status: DC | PRN
  Administered 2013-11-17: 3000 mL

## 2013-11-17 MED ORDER — ATENOLOL 50 MG PO TABS
50.0000 mg | ORAL_TABLET | Freq: Every day | ORAL | Status: DC
  Administered 2013-11-18 – 2013-11-20 (×3): 50 mg via ORAL
  Filled 2013-11-17 (×3): qty 1

## 2013-11-17 MED ORDER — FENTANYL CITRATE 0.05 MG/ML IJ SOLN
25.0000 ug | INTRAMUSCULAR | Status: DC | PRN
  Administered 2013-11-17 (×2): 50 ug via INTRAVENOUS

## 2013-11-17 MED ORDER — LACTATED RINGERS IV SOLN
INTRAVENOUS | Status: DC
  Administered 2013-11-17: 11:00:00 via INTRAVENOUS

## 2013-11-17 MED ORDER — ONDANSETRON HCL 4 MG PO TABS
4.0000 mg | ORAL_TABLET | Freq: Four times a day (QID) | ORAL | Status: DC | PRN
Start: 2013-11-17 — End: 2013-11-20
  Administered 2013-11-18: 4 mg via ORAL
  Filled 2013-11-17: qty 1

## 2013-11-17 MED ORDER — GLYCOPYRROLATE 0.2 MG/ML IJ SOLN
INTRAMUSCULAR | Status: DC | PRN
  Administered 2013-11-17: .8 mg via INTRAVENOUS

## 2013-11-17 MED ORDER — OXYCODONE HCL 5 MG PO TABS
5.0000 mg | ORAL_TABLET | ORAL | Status: DC | PRN
  Administered 2013-11-17 – 2013-11-20 (×14): 15 mg via ORAL
  Filled 2013-11-17 (×12): qty 3

## 2013-11-17 MED ORDER — ONDANSETRON HCL 4 MG/2ML IJ SOLN
INTRAMUSCULAR | Status: AC
  Filled 2013-11-17: qty 2

## 2013-11-17 MED ORDER — METHOCARBAMOL 500 MG PO TABS
ORAL_TABLET | ORAL | Status: AC
  Administered 2013-11-17: 16:00:00
  Filled 2013-11-17: qty 1

## 2013-11-17 MED ORDER — FENTANYL CITRATE 0.05 MG/ML IJ SOLN
INTRAMUSCULAR | Status: DC | PRN
  Administered 2013-11-17: 25 ug via INTRAVENOUS
  Administered 2013-11-17 (×2): 50 ug via INTRAVENOUS
  Administered 2013-11-17: 25 ug via INTRAVENOUS
  Administered 2013-11-17: 50 ug via INTRAVENOUS

## 2013-11-17 MED ORDER — ASPIRIN EC 325 MG PO TBEC
325.0000 mg | DELAYED_RELEASE_TABLET | Freq: Two times a day (BID) | ORAL | Status: DC
  Administered 2013-11-17 – 2013-11-20 (×6): 325 mg via ORAL
  Filled 2013-11-17 (×8): qty 1

## 2013-11-17 MED ORDER — HYDROMORPHONE HCL PF 1 MG/ML IJ SOLN
1.0000 mg | INTRAMUSCULAR | Status: DC | PRN
  Administered 2013-11-17: 1 mg via INTRAVENOUS

## 2013-11-17 MED ORDER — OXYCODONE HCL 5 MG PO TABS: 5.0000 mg | ORAL_TABLET | Freq: Once | ORAL | Status: DC | PRN

## 2013-11-17 MED ORDER — DOCUSATE SODIUM 100 MG PO CAPS
100.0000 mg | ORAL_CAPSULE | Freq: Two times a day (BID) | ORAL | Status: DC
  Administered 2013-11-17 – 2013-11-20 (×6): 100 mg via ORAL
  Filled 2013-11-17 (×6): qty 1

## 2013-11-17 MED ORDER — ACETAMINOPHEN 650 MG RE SUPP: 650.0000 mg | Freq: Four times a day (QID) | RECTAL | Status: DC | PRN

## 2013-11-17 MED ORDER — PROPOFOL 10 MG/ML IV BOLUS
INTRAVENOUS | Status: DC | PRN
  Administered 2013-11-17: 120 mg via INTRAVENOUS

## 2013-11-17 MED ORDER — PROPOFOL 10 MG/ML IV BOLUS
INTRAVENOUS | Status: AC
  Filled 2013-11-17: qty 20

## 2013-11-17 MED ORDER — SODIUM CHLORIDE 0.9 % IV SOLN
INTRAVENOUS | Status: DC
  Administered 2013-11-18: 04:00:00 via INTRAVENOUS

## 2013-11-17 MED ORDER — OXYCODONE HCL 5 MG PO TABS
ORAL_TABLET | ORAL | Status: AC
  Administered 2013-11-17: 16:00:00
  Filled 2013-11-17: qty 3

## 2013-11-17 MED ORDER — MIDAZOLAM HCL 5 MG/5ML IJ SOLN
INTRAMUSCULAR | Status: DC | PRN
  Administered 2013-11-17: 2 mg via INTRAVENOUS

## 2013-11-17 MED ORDER — ROCURONIUM BROMIDE 100 MG/10ML IV SOLN
INTRAVENOUS | Status: DC | PRN
  Administered 2013-11-17: 40 mg via INTRAVENOUS

## 2013-11-17 MED ORDER — MORPHINE SULFATE 15 MG PO TABS: 30.0000 mg | ORAL_TABLET | ORAL | Status: DC | PRN

## 2013-11-17 MED ORDER — POLYETHYLENE GLYCOL 3350 17 G PO PACK: 17.0000 g | PACK | Freq: Every day | ORAL | Status: DC | PRN

## 2013-11-17 MED ORDER — 0.9 % SODIUM CHLORIDE (POUR BTL) OPTIME
TOPICAL | Status: DC | PRN
  Administered 2013-11-17: 1000 mL

## 2013-11-17 MED ORDER — CEFAZOLIN SODIUM 1-5 GM-% IV SOLN
1.0000 g | Freq: Four times a day (QID) | INTRAVENOUS | Status: AC
  Administered 2013-11-17 – 2013-11-18 (×2): 1 g via INTRAVENOUS
  Filled 2013-11-17 (×2): qty 50

## 2013-11-17 MED ORDER — PANTOPRAZOLE SODIUM 40 MG PO TBEC
40.0000 mg | DELAYED_RELEASE_TABLET | Freq: Every day | ORAL | Status: DC
Start: 2013-11-17 — End: 2013-11-20
  Administered 2013-11-17 – 2013-11-20 (×4): 40 mg via ORAL
  Filled 2013-11-17 (×3): qty 1

## 2013-11-17 MED ORDER — DEXTROSE 5 % IV SOLN
500.0000 mg | Freq: Four times a day (QID) | INTRAVENOUS | Status: DC | PRN
  Filled 2013-11-17: qty 5

## 2013-11-17 MED ORDER — LISINOPRIL 40 MG PO TABS
40.0000 mg | ORAL_TABLET | Freq: Every day | ORAL | Status: DC
  Administered 2013-11-18 – 2013-11-20 (×2): 40 mg via ORAL
  Filled 2013-11-17 (×3): qty 1

## 2013-11-17 MED ORDER — NEOSTIGMINE METHYLSULFATE 10 MG/10ML IV SOLN
INTRAVENOUS | Status: DC | PRN
  Administered 2013-11-17: 4 mg via INTRAVENOUS

## 2013-11-17 MED ORDER — LIDOCAINE HCL (CARDIAC) 20 MG/ML IV SOLN
INTRAVENOUS | Status: DC | PRN
  Administered 2013-11-17: 40 mg via INTRAVENOUS

## 2013-11-17 MED ORDER — ALUM & MAG HYDROXIDE-SIMETH 200-200-20 MG/5ML PO SUSP: 30.0000 mL | ORAL | Status: DC | PRN

## 2013-11-17 MED ORDER — ONDANSETRON HCL 4 MG/2ML IJ SOLN
INTRAMUSCULAR | Status: DC | PRN
  Administered 2013-11-17: 4 mg via INTRAVENOUS

## 2013-11-17 MED ORDER — FENTANYL CITRATE 0.05 MG/ML IJ SOLN
INTRAMUSCULAR | Status: AC
  Filled 2013-11-17: qty 5

## 2013-11-17 MED ORDER — FENTANYL CITRATE 0.05 MG/ML IJ SOLN
INTRAMUSCULAR | Status: AC
  Administered 2013-11-17: 16:00:00
  Filled 2013-11-17: qty 2

## 2013-11-17 MED ORDER — EPHEDRINE SULFATE 50 MG/ML IJ SOLN
INTRAMUSCULAR | Status: DC | PRN
  Administered 2013-11-17: 10 mg via INTRAVENOUS

## 2013-11-17 MED ORDER — METOCLOPRAMIDE HCL 10 MG PO TABS: 5.0000 mg | ORAL_TABLET | Freq: Three times a day (TID) | ORAL | Status: DC | PRN

## 2013-11-17 MED ORDER — METOCLOPRAMIDE HCL 5 MG/ML IJ SOLN: 5.0000 mg | Freq: Three times a day (TID) | INTRAMUSCULAR | Status: DC | PRN

## 2013-11-17 MED ORDER — TRANEXAMIC ACID 100 MG/ML IV SOLN
1000.0000 mg | INTRAVENOUS | Status: AC
  Administered 2013-11-17: 1000 mg via INTRAVENOUS
  Filled 2013-11-17: qty 10

## 2013-11-17 SURGICAL SUPPLY — 53 items
BANDAGE GAUZE ELAST BULKY 4 IN (GAUZE/BANDAGES/DRESSINGS) IMPLANT
BENZOIN TINCTURE PRP APPL 2/3 (GAUZE/BANDAGES/DRESSINGS) ×3 IMPLANT
BLADE SAW SGTL 18X1.27X75 (BLADE) ×2 IMPLANT
BLADE SAW SGTL 18X1.27X75MM (BLADE) ×1
BLADE SURG ROTATE 9660 (MISCELLANEOUS) IMPLANT
CAPT HIP PF COP ×3 IMPLANT
CELLS DAT CNTRL 66122 CELL SVR (MISCELLANEOUS) ×1 IMPLANT
CLOSURE WOUND 1/2 X4 (GAUZE/BANDAGES/DRESSINGS) ×1
COVER SURGICAL LIGHT HANDLE (MISCELLANEOUS) ×3 IMPLANT
DRAPE C-ARM 42X72 X-RAY (DRAPES) ×3 IMPLANT
DRAPE STERI IOBAN 125X83 (DRAPES) ×3 IMPLANT
DRAPE U-SHAPE 47X51 STRL (DRAPES) ×9 IMPLANT
DRSG AQUACEL AG ADV 3.5X10 (GAUZE/BANDAGES/DRESSINGS) ×3 IMPLANT
DURAPREP 26ML APPLICATOR (WOUND CARE) ×3 IMPLANT
ELECT BLADE 4.0 EZ CLEAN MEGAD (MISCELLANEOUS) ×3
ELECT BLADE 6.5 EXT (BLADE) IMPLANT
ELECT CAUTERY BLADE 6.4 (BLADE) ×3 IMPLANT
ELECT PENCIL ROCKER SW 15FT (MISCELLANEOUS) ×3 IMPLANT
ELECT REM PT RETURN 9FT ADLT (ELECTROSURGICAL) ×3
ELECTRODE BLDE 4.0 EZ CLN MEGD (MISCELLANEOUS) ×1 IMPLANT
ELECTRODE REM PT RTRN 9FT ADLT (ELECTROSURGICAL) ×1 IMPLANT
ELIMINATOR HOLE APEX DEPUY (Hips) IMPLANT
FACESHIELD WRAPAROUND (MASK) ×9 IMPLANT
GLOVE BIOGEL PI IND STRL 8 (GLOVE) ×2 IMPLANT
GLOVE BIOGEL PI INDICATOR 8 (GLOVE) ×4
GLOVE ECLIPSE 8.0 STRL XLNG CF (GLOVE) ×3 IMPLANT
GLOVE ORTHO TXT STRL SZ7.5 (GLOVE) ×6 IMPLANT
GOWN STRL REUS W/ TWL XL LVL3 (GOWN DISPOSABLE) ×4 IMPLANT
GOWN STRL REUS W/TWL XL LVL3 (GOWN DISPOSABLE) ×8
HANDPIECE INTERPULSE COAX TIP (DISPOSABLE) ×2
KIT BASIN OR (CUSTOM PROCEDURE TRAY) ×3 IMPLANT
KIT ROOM TURNOVER OR (KITS) ×3 IMPLANT
MANIFOLD NEPTUNE II (INSTRUMENTS) ×3 IMPLANT
NS IRRIG 1000ML POUR BTL (IV SOLUTION) ×3 IMPLANT
PACK TOTAL JOINT (CUSTOM PROCEDURE TRAY) ×3 IMPLANT
PAD ARMBOARD 7.5X6 YLW CONV (MISCELLANEOUS) ×6 IMPLANT
RTRCTR WOUND ALEXIS 18CM MED (MISCELLANEOUS) ×3
SET HNDPC FAN SPRY TIP SCT (DISPOSABLE) ×1 IMPLANT
SPONGE LAP 18X18 X RAY DECT (DISPOSABLE) ×3 IMPLANT
SPONGE LAP 4X18 X RAY DECT (DISPOSABLE) IMPLANT
STRIP CLOSURE SKIN 1/2X4 (GAUZE/BANDAGES/DRESSINGS) ×2 IMPLANT
SUT ETHIBOND NAB CT1 #1 30IN (SUTURE) ×3 IMPLANT
SUT MNCRL AB 4-0 PS2 18 (SUTURE) ×3 IMPLANT
SUT VIC AB 0 CT1 27 (SUTURE) ×2
SUT VIC AB 0 CT1 27XBRD ANBCTR (SUTURE) ×1 IMPLANT
SUT VIC AB 1 CT1 27 (SUTURE) ×2
SUT VIC AB 1 CT1 27XBRD ANBCTR (SUTURE) ×1 IMPLANT
SUT VIC AB 2-0 CT1 27 (SUTURE) ×4
SUT VIC AB 2-0 CT1 TAPERPNT 27 (SUTURE) ×2 IMPLANT
TOWEL OR 17X24 6PK STRL BLUE (TOWEL DISPOSABLE) ×3 IMPLANT
TOWEL OR 17X26 10 PK STRL BLUE (TOWEL DISPOSABLE) ×3 IMPLANT
TRAY FOLEY CATH 16FRSI W/METER (SET/KITS/TRAYS/PACK) ×3 IMPLANT
WATER STERILE IRR 1000ML POUR (IV SOLUTION) IMPLANT

## 2013-11-17 NOTE — Anesthesia Postprocedure Evaluation (Signed)
Anesthesia Post Note  Patient: Autumn Lopez  Procedure(s) Performed: Procedure(s) (LRB): RIGHT TOTAL HIP ARTHROPLASTY ANTERIOR APPROACH (Right)  Anesthesia type: general  Patient location: PACU  Post pain: Pain level controlled  Post assessment: Patient's Cardiovascular Status Stable  Last Vitals:  Filed Vitals:   11/17/13 1640  BP: 145/71  Pulse: 56  Temp: 36.9 C  Resp: 16    Post vital signs: Reviewed and stable  Level of consciousness: sedated  Complications: No apparent anesthesia complications

## 2013-11-17 NOTE — H&P (Signed)
TOTAL HIP ADMISSION H&P  Patient is admitted for right total hip arthroplasty.  Subjective:  Chief Complaint: right hip pain  HPI: Autumn PalmsHarriette Lopez, 68 y.o. female, has a history of pain and functional disability in the right hip(s) due to arthritis and patient has failed non-surgical conservative treatments for greater than 12 weeks to include NSAID's and/or analgesics, corticosteriod injections, supervised PT with diminished ADL's post treatment, use of assistive devices, weight reduction as appropriate and activity modification.  Onset of symptoms was gradual starting 3 years ago with gradually worsening course since that time.The patient noted no past surgery on the right hip(s).  Patient currently rates pain in the right hip at 10 out of 10 with activity. Patient has night pain, worsening of pain with activity and weight bearing, trendelenberg gait, pain that interfers with activities of daily living, pain with passive range of motion and crepitus. Patient has evidence of subchondral sclerosis, periarticular osteophytes and joint space narrowing by imaging studies. This condition presents safety issues increasing the risk of falls.  There is no current active infection.  Patient Active Problem List   Diagnosis Date Noted  . Arthritis of right hip 11/17/2013  . Arthritis of left hip 07/28/2013  . Status post THR (total hip replacement) 07/28/2013   Past Medical History  Diagnosis Date  . Hypertension   . GERD (gastroesophageal reflux disease)   . Arthritis     "hips" (07/29/2013)  . EAVWUJWJ(191.4Headache(784.0)     Past Surgical History  Procedure Laterality Date  . Abdominal hysterectomy    . Tonsillectomy    . Total hip arthroplasty Left 07/28/2013    Procedure: LEFT TOTAL HIP ARTHROPLASTY ANTERIOR APPROACH;  Surgeon: Kathryne Hitchhristopher Y Blackman, MD;  [facility];  Service: Orthopedics;  Laterality: Left;    Prescriptions prior to admission  Medication Sig Dispense Refill  . amLODipine  (NORVASC) 10 MG tablet Take 10 mg by mouth daily.      Marland Kitchen. aspirin EC 325 MG EC tablet Take 1 tablet (325 mg total) by mouth 2 (two) times daily after a meal.  30 tablet  0  . atenolol (TENORMIN) 50 MG tablet Take 50 mg by mouth daily.      Marland Kitchen. lisinopril (PRINIVIL,ZESTRIL) 40 MG tablet Take 40 mg by mouth daily.      Marland Kitchen. morphine (MSIR) 30 MG tablet Take 1 tablet (30 mg total) by mouth every 4 (four) hours as needed for severe pain.  60 tablet  0  . omeprazole (PRILOSEC) 20 MG capsule Take 20 mg by mouth daily.      . Oxycodone HCl 10 MG TABS Take 1 tablet (10 mg total) by mouth every 8 (eight) hours as needed (for pain).  60 tablet  0  . tiZANidine (ZANAFLEX) 4 MG tablet Take 1 tablet (4 mg total) by mouth every 8 (eight) hours as needed for muscle spasms.  30 tablet  0  . trolamine salicylate (ASPERCREME) 10 % cream Apply 1 application topically as needed for muscle pain.       Marland Kitchen. zolpidem (AMBIEN) 10 MG tablet Take 10 mg by mouth at bedtime.       Allergies  Allergen Reactions  . Tape Other (See Comments)    redness    History  Substance Use Topics  . Smoking status: Never Smoker   . Smokeless tobacco: Never Used  . Alcohol Use: No    History reviewed. No pertinent family history.   Review of Systems  Musculoskeletal: Positive for joint pain.  All other systems reviewed and are negative.   Objective:  Physical Exam  Constitutional: She is oriented to person, place, and time. She appears well-developed and well-nourished.  HENT:  Head: Normocephalic and atraumatic.  Eyes: EOM are normal. Pupils are equal, round, and reactive to light.  Neck: Normal range of motion. Neck supple.  Cardiovascular: Normal rate and regular rhythm.   Respiratory: Effort normal and breath sounds normal.  GI: Soft. Bowel sounds are normal.  Musculoskeletal:       Right hip: She exhibits decreased range of motion, decreased strength and bony tenderness.  Neurological: She is alert and oriented to person,  place, and time.  Skin: Skin is warm and dry.  Psychiatric: She has a normal mood and affect.    Vital signs in last 24 hours: Temp:  [98.7 F (37.1 C)] 98.7 F (37.1 C) (06/23 1053) Pulse Rate:  [54] 54 (06/23 1053) Resp:  [18] 18 (06/23 1053) BP: (162)/(73) 162/73 mmHg (06/23 1053) SpO2:  [99 %] 99 % (06/23 1053) Weight:  [77.111 kg (170 lb)] 77.111 kg (170 lb) (06/23 1053)  Labs:   Estimated body mass index is 28.29 kg/(m^2) as calculated from the following:   Height as of this encounter: 5\' 5"  (1.651 m).   Weight as of this encounter: 77.111 kg (170 lb).   Imaging Review Plain radiographs demonstrate severe degenerative joint disease of the right hip(s). The bone quality appears to be good for age and reported activity level.  Assessment/Plan:  End stage arthritis, right hip(s)  The patient history, physical examination, clinical judgement of the provider and imaging studies are consistent with end stage degenerative joint disease of the right hip(s) and total hip arthroplasty is deemed medically necessary. The treatment options including medical management, injection therapy, arthroscopy and arthroplasty were discussed at length. The risks and benefits of total hip arthroplasty were presented and reviewed. The risks due to aseptic loosening, infection, stiffness, dislocation/subluxation,  thromboembolic complications and other imponderables were discussed.  The patient acknowledged the explanation, agreed to proceed with the plan and consent was signed. Patient is being admitted for inpatient treatment for surgery, pain control, PT, OT, prophylactic antibiotics, VTE prophylaxis, progressive ambulation and ADL's and discharge planning.The patient is planning to be discharged home with home health services

## 2013-11-17 NOTE — Brief Op Note (Signed)
11/17/2013  2:53 PM  PATIENT:  Autumn Lopez  68 y.o. female  PRE-OPERATIVE DIAGNOSIS:  Endstage arthritis right hip  POST-OPERATIVE DIAGNOSIS:  Endstage arthritis right hip  PROCEDURE:  Procedure(s): RIGHT TOTAL HIP ARTHROPLASTY ANTERIOR APPROACH (Right)  SURGEON:  Surgeon(s) and Role:    * Kathryne Hitchhristopher Y Blackman, MD - Primary  PHYSICIAN ASSISTANT: Rexene EdisonGil Clark, PA-C  ANESTHESIA:   spinal  EBL:  Total I/O In: -  Out: 350 [Urine:100; Blood:250]  BLOOD ADMINISTERED:none  DRAINS: none   LOCAL MEDICATIONS USED:  NONE  SPECIMEN:  No Specimen  DISPOSITION OF SPECIMEN:  N/A  COUNTS:  YES  TOURNIQUET:  * No tourniquets in log *  DICTATION: .Other Dictation: Dictation Number [id number]  PLAN OF CARE: Admit to inpatient   PATIENT DISPOSITION:  PACU - hemodynamically stable.   Delay start of Pharmacological VTE agent (>24hrs) due to surgical blood loss or risk of bleeding: no

## 2013-11-17 NOTE — Transfer of Care (Signed)
Immediate Anesthesia Transfer of Care Note  Patient: Autumn Lopez  Procedure(s) Performed: Procedure(s): RIGHT TOTAL HIP ARTHROPLASTY ANTERIOR APPROACH (Right)  Patient Location: PACU  Anesthesia Type:General  Level of Consciousness: awake, alert  and oriented  Airway & Oxygen Therapy: Patient Spontanous Breathing  Post-op Assessment: Report given to PACU RN  Post vital signs: Reviewed and stable  Complications: No apparent anesthesia complications

## 2013-11-17 NOTE — Anesthesia Preprocedure Evaluation (Addendum)
Anesthesia Evaluation  Patient identified by MRN, date of birth, ID band Patient awake    Reviewed: Allergy & Precautions, H&P , NPO status , Patient's Chart, lab work & pertinent test results, reviewed documented beta blocker date and time   Airway Mallampati: II TM Distance: >3 FB Neck ROM: Full    Dental  (+) Teeth Intact,    Pulmonary  breath sounds clear to auscultation        Cardiovascular hypertension, Rhythm:Regular Rate:Normal     Neuro/Psych    GI/Hepatic   Endo/Other    Renal/GU      Musculoskeletal   Abdominal   Peds  Hematology   Anesthesia Other Findings   Reproductive/Obstetrics                         Anesthesia Physical Anesthesia Plan  ASA: II  Anesthesia Plan: General   Post-op Pain Management:    Induction: Intravenous  Airway Management Planned: Oral ETT  Additional Equipment:   Intra-op Plan:   Post-operative Plan: Extubation in OR  Informed Consent: I have reviewed the patients History and Physical, chart, labs and discussed the procedure including the risks, benefits and alternatives for the proposed anesthesia with the patient or authorized representative who has indicated his/her understanding and acceptance.   Dental advisory given  Plan Discussed with: CRNA and Anesthesiologist  Anesthesia Plan Comments: (DJD R. Hip Hypertension GERD Loose R. Upper tooth  Plan GA with oral ETT  Kipp Broodavid Joslin)        Anesthesia Quick Evaluation

## 2013-11-17 NOTE — Progress Notes (Signed)
Orthopedic Tech Progress Note Patient Details:  Autumn Lopez [birth year] [name]  Patient ID: Autumn PalmsHarriette Lopez, female   DOB: [birth year], 68 y.o.   MRN: [id number]   Autumn Lopez, Autumn Lopez 11/17/2013, 5:15 PMTrapeze bar

## 2013-11-18 LAB — BASIC METABOLIC PANEL
BUN: 19 mg/dL (ref 6–23)
CALCIUM: 8.6 mg/dL (ref 8.4–10.5)
CO2: 29 mEq/L (ref 19–32)
Chloride: 100 mEq/L (ref 96–112)
Creatinine, Ser: 1 mg/dL (ref 0.50–1.10)
GFR calc Af Amer: 67 mL/min — ABNORMAL LOW (ref >90)
GFR, EST NON AFRICAN AMERICAN: 58 mL/min — AB (ref >90)
GLUCOSE: 113 mg/dL — AB (ref 70–99)
Potassium: 3.7 mEq/L (ref 3.7–5.3)
Sodium: 141 mEq/L (ref 137–147)

## 2013-11-18 LAB — CBC
HEMATOCRIT: 30.7 % — AB (ref 36.0–46.0)
HEMOGLOBIN: 9.6 g/dL — AB (ref 12.0–15.0)
MCH: 29.1 pg (ref 26.0–34.0)
MCHC: 31.3 g/dL (ref 30.0–36.0)
MCV: 93 fL (ref 78.0–100.0)
Platelets: 163 10*3/uL (ref 150–400)
RBC: 3.3 MIL/uL — ABNORMAL LOW (ref 3.87–5.11)
RDW: 14.6 % (ref 11.5–15.5)
WBC: 7.7 10*3/uL (ref 4.0–10.5)

## 2013-11-18 NOTE — Evaluation (Signed)
Agree with note. 14:19-14:52 1EV, 2 [place] 11/18/2013 Martie RoundJulie Mayberry, OTR/L Pager: [phone]

## 2013-11-18 NOTE — Evaluation (Signed)
Occupational Therapy Evaluation Patient Details Name: Autumn Lopez MRN: [id number] DOB: 02-26-1946 Today's Date: 11/18/2013    History of Present Illness 68 y.o. female s/p right total hip arthroplasty. Hx of left THA (2015), HTN and GERD.   Clinical Impression   PTA pt was independent in ADLs and now present with acute pain, decreased ROM and generalized weakness from above limiting her independence with self care activities. Educated pt on available DME and AE to assist her with LB ADLs. Pt will have assistance at home from family and would benefit from additional acute OT to maximized her independence prior to d/c. Will continue to follow.    Follow Up Recommendations  No OT follow up    Equipment Recommendations  Tub/shower bench       Precautions / Restrictions Precautions Precautions: None Precaution Comments: Direct anterior hip replacement - no precautions Restrictions Weight Bearing Restrictions: Yes RLE Weight Bearing: Weight bearing as tolerated      Mobility Bed Mobility Overal bed mobility: Needs Assistance Bed Mobility: Sit to Supine       Sit to supine: Min assist   General bed mobility comments: Min assist for LEs support to lift up to the bed. Pt with good trunk control. VCs for technique. Requires extra time.  Transfers Overall transfer level: Needs assistance Equipment used: Rolling walker (2 wheeled) Transfers: Sit to/from Stand Sit to Stand: Mod assist         General transfer comment: Min assist for boost second person available for safety, performed from lowest bed setting. VCs for hand placement. Decreased WB through RLE upon standing and walking.    Balance Overall balance assessment: Needs assistance Sitting-balance support: No upper extremity supported;Feet supported Sitting balance-Leahy Scale: Good     Standing balance support: Single extremity supported Standing balance-Leahy Scale: Poor                               ADL Overall ADL's : Needs assistance/impaired Eating/Feeding: Set up;Sitting   Grooming: Set up;Sitting   Upper Body Bathing: Sitting;Set up   Lower Body Bathing: Moderate assistance;Sit to/from stand   Upper Body Dressing : Set up;Sitting   Lower Body Dressing: Sit to/from stand;Cueing for sequencing;Moderate assistance   Toilet Transfer: RW;BSC;Ambulation;Minimal assistance   Toileting- Clothing Manipulation and Hygiene: Sit to/from stand;Minimal assistance       Functional mobility during ADLs: Rolling walker;Minimal assistance General ADL Comments: Pt was feeling nauseous at beginning of tx and had been premedicated. Pt ambulated using the RW from the chair to the [facility] toilet, but needed to ride in the chair to get back. Educated pt on available AE and tub bench for tub transfer and pt expressed interest in practicing prior to d/c.                Pertinent Vitals/Pain Pt rated pain at the beginning of tx as a 5/10 and at the end as a 6/10. Nurse was notified and pt was given pain medicine at the end of tx. Pt had been premedicated for nausea prior to tx.     Hand Dominance Right   Extremity/Trunk Assessment Upper Extremity Assessment Upper Extremity Assessment: Overall WFL for tasks assessed   Lower Extremity Assessment Lower Extremity Assessment: Defer to PT evaluation       Communication Communication Communication: No difficulties   Cognition Arousal/Alertness: Awake/alert Behavior During Therapy: WFL for tasks assessed/performed Overall Cognitive Status: Within Functional Limits  for tasks assessed                                Home Living Family/patient expects to be discharged to:: Private residence Living Arrangements: Children (staying at daughters house) Available Help at Discharge: Family;Friend(s);Available 24 hours/day Type of Home: House Home Access: Stairs to enter Entergy CorporationEntrance Stairs-Number of Steps: 2 Entrance  Stairs-Rails: None Home Layout: One level     Bathroom Shower/Tub: Tub/shower unit Shower/tub characteristics: Curtain FirefighterBathroom Toilet: Standard     Home Equipment: Environmental consultantWalker - 2 wheels;Bedside commode;Cane - single point          Prior Functioning/Environment Level of Independence: Independent with assistive device(s)        Comments: walker for ambulation    OT Diagnosis: Generalized weakness;Acute pain   OT Problem List: Decreased strength;Decreased range of motion;Decreased activity tolerance;Impaired balance (sitting and/or standing);Decreased knowledge of use of DME or AE   OT Treatment/Interventions: Self-care/ADL training;Balance training;Energy conservation;DME and/or AE instruction;Patient/family education    OT Goals(Current goals can be found in the care plan section) Acute Rehab OT Goals Patient Stated Goal: Feel better OT Goal Formulation: With patient Time For Goal Achievement: 11/25/13 Potential to Achieve Goals: Good  OT Frequency: Min 2X/week    End of Session Equipment Utilized During Treatment: Gait belt;Rolling walker  Activity Tolerance: Patient tolerated treatment well Patient left: in bed;with call bell/phone within reach;with family/visitor present   Time:  -    Charges:    G-CodesMaurene Capes:    [place], Whitney 11/18/2013, 3:19 PM

## 2013-11-18 NOTE — Op Note (Signed)
NAMPosey Boyer:  Lopez, Autumn           ACCOUNT NO.:  [id number]  MEDICAL RECORD NO.:  [id number]  LOCATION:  5N28C                        FACILITY:  [facility]  PHYSICIAN:  Vanita PandaChristopher Y. Magnus IvanBlackman, M.D.DATE OF BIRTH:  25-Dec-1945  DATE OF PROCEDURE:  11/17/2013 DATE OF DISCHARGE:                              OPERATIVE REPORT   PREOPERATIVE DIAGNOSIS:  Severe end-stage arthritis and degenerative joint disease, right hip.  POSTOPERATIVE DIAGNOSIS:  Severe end-stage arthritis and degenerative joint disease, right hip.  PROCEDURE:  Right total hip arthroplasty through direct anterior approach.  IMPLANTS:  DePuy Sector Gription acetabular component size 48 with apex hole eliminator guide, size 32+ 4 neutral polyethylene liner, size 11 Corail femoral component with standard offset, size 32+ 1 ceramic hip ball.  SURGEON:  Vanita PandaChristopher Y. Magnus IvanBlackman, M.D.  ASSISTANT:  Richardean CanalGilbert Clark, PA-C  ANESTHESIA:  General.  ANTIBIOTICS:  2 g IV Ancef.  BLOOD LOSS:  250 mL.  COMPLICATIONS:  None.  INDICATIONS:  Autumn Lopez is a 68 year old female with bilateral hip osteoarthritis.  She underwent successful left total hip arthroplasty in March of this year, now presents to have her right hip replaced.  She has well documented evidence of end-stage arthritis of this hip.  This bone-on-bone wear with periarticular osteophytes, loss of joint space, and subchondral sclerotic changes.  She has done very well with her left total hip and now due to the decrease in activities of daily living, her daily pain, and decreased mobility; she wishes to proceed with a total hip arthroplasty on the right side.  She understands the risks of acute blood loss anemia, nerve and vessel injury, fracture, infection, dislocation, and DVT.  She also understands the goal of surgery is decreased pain, improved mobility, and overall improved quality of life.  PROCEDURE DESCRIPTION:  After informed consent was obtained,  appropriate right hip was marked.  She was brought to the operating room and general anesthesia was obtained while she was on the stretcher.  Foley catheter was placed and then traction boots were placed on both her feet.  She was placed supine on the HANA fracture table with the perineal post in place and both legs in inline skeletal traction devices, but no traction applied.  Her right operative hip was then prepped and draped with DuraPrep and sterile drapes.  A time-out was called, she was identified as correct patient, correct right hip.  I then made incision inferior and posterior to the anterosuperior iliac spine and carried this obliquely down the leg.  We dissected down tensor fascia lata muscle and the tensor fascia was divided longitudinally, so we could proceed with a direct anterior approach to the hip.  We cauterized the lateral femoral circumflex vessels and placed Cobra retractors around the lateral neck and up underneath the rectus femoris over the medial neck.  We then opened up the hip capsule in L-type format and found the joint effusion. There again osteophytes around the femoral neck.  We placed Cobra retractors within the joint capsule and then made our femoral neck cut with an oscillating saw just proximal to the lesser trochanter and completed this with an osteotome.  We placed a corkscrew guide in the femoral head and removed the femoral  head in its entirety and found to have large areas of devoid of cartilage.  We smoothed down, worn down to bone.  We passed this off the table and then placed a bent Hohmann medially and a Cobra retractor laterally.  We released transverse acetabular ligament and then cleaned the acetabular debris including remnants of acetabular labrum.  We began reaming under direct visualization from a size 42 in 2 mm increments up to a size 48 with all reamers placed under direct visualization and the last reamer also on direct fluoroscopy so  we could obtain our depth of reaming, inclination and anteversion.  Once we were pleased with this, we placed the real DePuy Sector Gription acetabular component size 48.  The apex hole eliminator guide and a 32+ 4 neutral polyethylene liner.  Attention was then turned to the femur with the leg externally rotated to 100 degrees, extended and adducted, we were able to place a Mueller retractor medially and Hohmann retractor behind the greater trochanter.  We released the lateral joint capsule and then used a box cutting osteotome to enter femoral canal and a rongeur lateralized.  I then began broaching using Corail broaching system from a size 8 up to a size 11 with the 11 felt to be stable.  We trialed a 32+ 1 hip ball.  We rolled the leg back over and up with traction and internal rotation, reduced into the pelvis.  It was stable with internal and external rotation and her leg lengths were measured equal as well as offsets under direct fluoroscopy.  We then dislocated the hip and removed the trial components.  We placed the real Corail femoral component size 11 with standard offset and the real 32+ 1 ceramic hip ball.  We reduced this back in the acetabulum, again it was stable.  We copiously irrigated the soft tissue with normal saline solution.  We closed the joint capsule with interrupted #1 Ethibond suture followed by running #1 Vicryl in the tensor fascia, 0 Vicryl the deep tissue, 2-0 Vicryl in subcutaneous tissue, 4-0 Monocryl subcuticular stitch and Steri-Strips on the skin. An Aquacel dressing was applied.  She was then taken off the HANA table, awakened, extubated, and taken to recovery room in stable condition. All final counts correct.  There were no complications noted.  Of note, Richardean CanalGilbert Clark, PA-C, assisted in entire case, and his assistance was crucial for completing this case.     Vanita Pandahristopher Y. Magnus IvanBlackman, M.D.     CYB/MEDQ  D:  11/17/2013  T:  11/18/2013  Job:   [id number]

## 2013-11-18 NOTE — Progress Notes (Signed)
PT Cancellation Note  Patient Details Name: Autumn Lopez MRN: [id number] DOB: 21-Feb-1946   Cancelled Treatment:    Reason Eval/Treat Not Completed: Pain limiting ability to participate Attempted to see pt for gait training this afternoon. Pt reports no improvement in pain and requests PT session be held until tomorrow. Will follow up in AM. Nurse notified.  [address]Shondell Poulson Secor [place], [place] [phone]   Berton MountBarbour, Grainger Mccarley S 11/18/2013, 2:26 PM

## 2013-11-18 NOTE — Progress Notes (Signed)
Utilization review completed.  

## 2013-11-18 NOTE — Evaluation (Signed)
Physical Therapy Evaluation Patient Details Name: Autumn Lopez MRN: [id number] DOB: [birth year] Today's Date: 11/18/2013   History of Present Illness  68 y.o. female s/p right total hip arthroplasty. Hx of left THA (2015), HTN and GERD.  Clinical Impression  Patient is seen following the above procedure and presents with functional limitations due to the deficits listed below (see PT Problem List). Anticipate pt will do well with therapy and plan for d/c to her own home or pt daughter's home, and at either residence she will have 24 hour care from family and friends. Patient will benefit from skilled PT to increase their independence and safety with mobility to allow discharge to the venue listed below.      Follow Up Recommendations Home health PT;Supervision for mobility/OOB    Equipment Recommendations       Recommendations for Other Services OT consult     Precautions / Restrictions Precautions Precautions: None Precaution Comments: Direct anterior hip replacement - no precautions Restrictions Weight Bearing Restrictions: Yes RLE Weight Bearing: Weight bearing as tolerated      Mobility  Bed Mobility Overal bed mobility: Needs Assistance Bed Mobility: Supine to Sit     Supine to sit: Min assist     General bed mobility comments: Min assist for RLE support to off of bed. Pt with good trunk control. VCs for technique. Requires extra time.  Transfers Overall transfer level: Needs assistance Equipment used: Rolling walker (2 wheeled) Transfers: Sit to/from [employer]GI CorporationStand;Stand Pivot Transfers Sit to Stand: Min assist;+2 safety/equipment Stand pivot transfers: Min assist;+2 safety/equipment       General transfer comment: Min assist for boost second person available for safety, performed from lowest bed setting. VCs for hand placement. Decreased WB through RLE upon standing 2/2 pain. Min assist for RW control during stand pivot transfer. Pt very tearful due to pain when WB  through RLE.  Ambulation/Gait                Stairs            Wheelchair Mobility    Modified Rankin (Stroke Patients Only)       Balance Overall balance assessment: Needs assistance Sitting-balance support: No upper extremity supported;Feet supported Sitting balance-Leahy Scale: Good     Standing balance support: Single extremity supported Standing balance-Leahy Scale: Poor                               Pertinent Vitals/Pain 6/10 pain Nurse aware- administered pain medication towards end of therapy session Patient repositioned in chair for comfort.     Home Living Family/patient expects to be discharged to:: Private residence Living Arrangements: Children (Staying at daughter's house) Available Help at Discharge: Family;Friend(s);Available 24 hours/day (daughter/friends available 24 hrs) Type of Home: House Home Access: Stairs to enter Entrance Stairs-Rails: None Entrance Stairs-Number of Steps: 2 Home Layout: One level Home Equipment: Walker - 2 wheels;Bedside commode;Cane - single point      Prior Function Level of Independence: Independent with assistive device(s)         Comments: walker for ambulation     Hand Dominance   Dominant Hand: Right    Extremity/Trunk Assessment   Upper Extremity Assessment: Defer to OT evaluation           Lower Extremity Assessment: RLE deficits/detail RLE Deficits / Details: decreased strength and ROM as expected post op THA       Communication   Communication:  No difficulties  Cognition Arousal/Alertness: Awake/alert Behavior During Therapy: WFL for tasks assessed/performed Overall Cognitive Status: Within Functional Limits for tasks assessed                      General Comments General comments (skin integrity, edema, etc.): Pt very tearful upon standing secondary to right hip pain. Did not attempt ambulation as pt required to sit in chair after standing. Nurse aware  and administered pain medication towards end of therapy session.    Exercises Total Joint Exercises Ankle Circles/Pumps: AROM;Both;10 reps;Seated Quad Sets: AROM;Both;10 reps;Seated      Assessment/Plan    PT Assessment Patient needs continued PT services  PT Diagnosis Difficulty walking;Abnormality of gait;Acute pain   PT Problem List Decreased strength;Decreased range of motion;Decreased activity tolerance;Decreased balance;Decreased mobility;Decreased knowledge of use of DME;Pain;Impaired sensation  PT Treatment Interventions DME instruction;Gait training;Stair training;Functional mobility training;Therapeutic activities;Therapeutic exercise;Balance training;Neuromuscular re-education;Patient/family education;Modalities   PT Goals (Current goals can be found in the Care Plan section) Acute Rehab PT Goals Patient Stated Goal: Feel better PT Goal Formulation: With patient Time For Goal Achievement: 11/25/13 Potential to Achieve Goals: Good    Frequency 7X/week   Barriers to discharge        Co-evaluation               End of Session   Activity Tolerance: Patient limited by pain Patient left: in chair;with call bell/phone within reach;with family/visitor present Nurse Communication: Mobility status;Patient requests pain meds         Time: 8416-60631046-1108 PT Time Calculation (min): 22 min   Charges:   PT Evaluation $Initial PT Evaluation Tier I: 1 Procedure PT Treatments $Therapeutic Activity: 8-22 mins   PT G Codes:         Charlsie MerlesLogan Secor [place], [place] [phone]  Berton MountBarbour, Logan S 11/18/2013, 1:25 PM

## 2013-11-18 NOTE — Progress Notes (Signed)
Subjective: 1 Day Post-Op Procedure(s) (LRB): RIGHT TOTAL HIP ARTHROPLASTY ANTERIOR APPROACH (Right) Patient reports pain as moderate.  Asymptomatic acute blood loss anemia.  Objective: Vital signs in last 24 hours: Temp:  [97 F (36.1 C)-99.2 F (37.3 C)] 99.2 F (37.3 C) (06/24 0354) Pulse Rate:  [49-62] 59 (06/24 0354) Resp:  [10-19] 16 (06/24 0354) BP: (132-162)/(65-87) 132/68 mmHg (06/24 0354) SpO2:  [95 %-100 %] 98 % (06/24 0354) Weight:  [77.111 kg (170 lb)] 77.111 kg (170 lb) (06/23 1053)  Intake/Output from previous day: 06/23 0701 - 06/24 0700 In: 850 [I.V.:800; IV Piggyback:50] Out: 900 [Urine:650; Blood:250] Intake/Output this shift:     Recent Labs  11/18/13 0505  HGB 9.6*    Recent Labs  11/18/13 0505  WBC 7.7  RBC 3.30*  HCT 30.7*  PLT 163   No results found for this basename: NA, K, CL, CO2, BUN, CREATININE, GLUCOSE, CALCIUM,  in the last 72 hours No results found for this basename: LABPT, INR,  in the last 72 hours  Sensation intact distally Intact pulses distally Dorsiflexion/Plantar flexion intact Incision: dressing C/D/I  Assessment/Plan: 1 Day Post-Op Procedure(s) (LRB): RIGHT TOTAL HIP ARTHROPLASTY ANTERIOR APPROACH (Right) Up with therapy  Autumn Lopez,Autumn Lopez 11/18/2013, 7:14 AM

## 2013-11-18 NOTE — Care Management Note (Signed)
CARE MANAGEMENT NOTE 11/18/2013  Patient:  Autumn Lopez,Autumn Lopez   Account Number:  [id number]  Date Initiated:  11/18/2013  Documentation initiated by:  Vance PeperBRADY,SUSAN  Subjective/Objective Assessment:   68 yr old female s/p right total hip arthroplasty.     Action/Plan:   Case manager spoke with patient concerning home health and DME needs at discharge. Choice offered. Referral called to [facility]. Patient has family support at discharge.  Patient has RW and 3in1.   Anticipated DC Date:  11/19/2013   Anticipated DC Plan:  HOME W HOME HEALTH SERVICES      DC Planning Services  CM consult      PAC Choice  DURABLE MEDICAL EQUIPMENT  HOME HEALTH   Choice offered to / List presented to:  C-1 Patient   DME arranged  NA        HH arranged  HH-2 PT      HH agency  [facility]   Status of service:  Completed, signed off Medicare Important Message given?  YES (If response is "NO", the following Medicare IM given date fields will be blank) Date Medicare IM given:  11/18/2013 Date Additional Medicare IM given:    Discharge Disposition:  HOME W HOME HEALTH SERVICES  Per UR Regulation:  Reviewed for med. necessity/level of care/duration of stay

## 2013-11-19 ENCOUNTER — Encounter (HOSPITAL_COMMUNITY): Payer: Self-pay | Admitting: Orthopaedic Surgery

## 2013-11-19 LAB — CBC
HCT: 29.6 % — ABNORMAL LOW (ref 36.0–46.0)
HEMOGLOBIN: 9.3 g/dL — AB (ref 12.0–15.0)
MCH: 30 pg (ref 26.0–34.0)
MCHC: 31.4 g/dL (ref 30.0–36.0)
MCV: 95.5 fL (ref 78.0–100.0)
PLATELETS: 153 10*3/uL (ref 150–400)
RBC: 3.1 MIL/uL — ABNORMAL LOW (ref 3.87–5.11)
RDW: 14.7 % (ref 11.5–15.5)
WBC: 8.9 10*3/uL (ref 4.0–10.5)

## 2013-11-19 NOTE — Progress Notes (Signed)
I have read and agree with this note.   Time in/out: 14:30-15:02 Total time: 32 minutes (2SC)  Ignacia Palmaathy Leonard, OTR/L [phone]

## 2013-11-19 NOTE — Progress Notes (Signed)
Occupational Therapy Treatment and Discharge Patient Details Name: Autumn Lopez MRN: [id number] DOB: 1945/11/21 Today's Date: 11/19/2013    History of present illness 68 y.o. female s/p right total hip arthroplasty. Hx of left THA (2015), HTN and GERD.   OT comments  Focus of today's session was on hip kit AE, tub transfer and reviewing compensatory strategies and sequencing for LB ADLs. Educated pt on proper tub transfer using a tub bench, LB bathing/dressing AE and had her demonstrate understanding. Pt feels confident with self care tasks and will have 24/7 supervision at home, therefore pt has no further OT needs. We will sign off.  Follow Up Recommendations  No OT follow up          Precautions / Restrictions Precautions Precautions: None Precaution Comments: Direct anterior hip replacement - no precautions Restrictions Weight Bearing Restrictions: Yes RLE Weight Bearing: Weight bearing as tolerated       Mobility Bed Mobility Overal bed mobility: Needs Assistance Bed Mobility: Sit to Supine     Sit to supine: Supervision   General bed mobility comments: good trunk control, pt requires extra time for LEs  Transfers Overall transfer level: Needs assistance Equipment used: Rolling walker (2 wheeled) Transfers: Sit to/from Stand Sit to Stand: Min guard         General transfer comment: Encouraged pt to rock her body a few times to build momentum to assist with powering up from sitting.    Balance Overall balance assessment: Needs assistance Sitting-balance support: No upper extremity supported;Feet supported Sitting balance-Leahy Scale: Good     he Standing balance support: Single extremity supported Standing balance-Leahy Scale: Poor                     ADL Overall ADL's : Needs assistance/impaired             Lower Body Bathing: Sit to/from stand;With adaptive equipment;Min guard       Lower Body Dressing: Sit to/from stand;Cueing for  sequencing;With adaptive equipment;Min guard           Tub/ Shower Transfer: Tub transfer;Minimal assistance;Ambulation;Rolling walker;Tub bench Tub/Shower Transfer Details (indicate cue type and reason): A for RLE only Functional mobility during ADLs: Rolling walker;Min guard General ADL Comments: Pt had just finished with PT prior to tx. Educated and demonstrated technique for tub transfer with the tub bench. Pt needed A only for her RLE and VC for hand placement and sequencing. Educated pt on hip kit AE to assist with LB bathing/dressing and pt stated that her brother had all the equipment except for the shoe horn to which she would think about getting.                 Cognition   Behavior During Therapy: WFL for tasks assessed/performed Overall Cognitive Status: Within Functional Limits for tasks assessed                                    Pertinent Vitals/ Pain       Pt received pain medicine from nurse at the beginning of tx.         Frequency Min 2X/week     Progress Toward Goals  OT Goals(current goals can now be found in the care plan section)  Progress towards OT goals: Education completed, D/C from OT.  Acute Rehab OT Goals Patient Stated Goal: Feel better OT Goal Formulation: With patient  Time For Goal Achievement: 11/25/13 Potential to Achieve Goals: Good ADL Goals Pt Will Perform Tub/Shower Transfer: Tub transfer;with supervision;ambulating;tub bench;rolling walker  Plan Discharge plan remains appropriate       End of Session Equipment Utilized During Treatment: Gait belt;Rolling walker   Activity Tolerance Patient tolerated treatment well   Patient Left in bed;with call bell/phone within reach           Time:  -     Charges:    Lyda Perone 11/19/2013, 3:33 PM

## 2013-11-19 NOTE — Progress Notes (Signed)
Physical Therapy Treatment Patient Details Name: Autumn Lopez MRN: [id number] DOB: 03/15/1946 Today's Date: 11/19/2013    History of Present Illness 68 y.o. female s/p right total hip arthroplasty. Hx of left THA (2015), HTN and GERD.    PT Comments    Patient with some progress this AM, ambulating up to 35 with rolling walker and min guard for safety; very slow and guarded secondary to pain and reported nausea. Will attempt stair training this afternoon as pt is able. Patient will continue to benefit from skilled physical therapy services to further improve independence with functional mobility.   Follow Up Recommendations  Home health PT;Supervision for mobility/OOB     Equipment Recommendations       Recommendations for Other Services OT consult     Precautions / Restrictions Precautions Precautions: None Precaution Comments: Direct anterior hip replacement - no precautions Restrictions Weight Bearing Restrictions: Yes RLE Weight Bearing: Weight bearing as tolerated    Mobility  Bed Mobility Overal bed mobility: Needs Assistance Bed Mobility: Supine to Sit     Supine to sit: Min assist     General bed mobility comments: Min assist for RLE support to off of bed. Pt with good trunk control. VCs for technique. Requires extra time.  Transfers Overall transfer level: Needs assistance Equipment used: Rolling walker (2 wheeled) Transfers: Sit to/from Stand Sit to Stand: Min assist         General transfer comment: Min assist for boost performed from lowest bed setting, Min guard from [facility]. VCs for hand placement. Improved WB throughr RLE.  Ambulation/Gait Ambulation/Gait assistance: Min guard Ambulation Distance (Feet): 35 Feet Assistive device: Rolling walker (2 wheeled) Gait Pattern/deviations: Step-to pattern;Decreased step length - left;Decreased stance time - right;Antalgic   Gait velocity interpretation: Below normal speed for age/gender General Gait  Details: Very slow and guarded gait. Difficulty advancing RLE forward but improved throughout therapy session. VCs for sequencing and education for safe use of DME equipment.   Stairs            Wheelchair Mobility    Modified Rankin (Stroke Patients Only)       Balance                                    Cognition Arousal/Alertness: Awake/alert Behavior During Therapy: WFL for tasks assessed/performed Overall Cognitive Status: Within Functional Limits for tasks assessed                      Exercises Total Joint Exercises Ankle Circles/Pumps: AROM;Both;10 reps;Seated Quad Sets: AROM;Both;10 reps;Seated    General Comments General comments (skin integrity, edema, etc.): Pt reports nausea increases when she ambulates. Nurse notfied      Pertinent Vitals/Pain 5/10 pain Reports nausea Nurse aware and in room at end of therapy session Patient repositioned in chair for comfort.     Home Living                      Prior Function            PT Goals (current goals can now be found in the care plan section) Acute Rehab PT Goals PT Goal Formulation: With patient Time For Goal Achievement: 11/25/13 Potential to Achieve Goals: Good Progress towards PT goals: Progressing toward goals    Frequency  7X/week    PT Plan Current plan remains appropriate    Co-evaluation  End of Session   Activity Tolerance: Patient tolerated treatment well Patient left: in chair;with call bell/phone within reach;with nursing/sitter in room     Time: 1053-1120 PT Time Calculation (min): 27 min  Charges:  $Gait Training: 8-22 mins $Therapeutic Activity: 8-22 mins                    G Codes:      BJ's WholesaleLogan Secor [place], [place] [phone]  Berton MountBarbour, Logan S 11/19/2013, 12:52 PM

## 2013-11-19 NOTE — Progress Notes (Signed)
Physical Therapy Treatment Patient Details Name: Autumn PalmsHarriette Krawczyk MRN: [id number] DOB: 25-Jan-1946 Today's Date: 11/19/2013    History of Present Illness 68 y.o. female s/p right total hip arthroplasty. Hx of left THA (2015), HTN and GERD.    PT Comments    Patient progressing towards physical therapy goals, ambulating up to 55 feet this afternoon with supervision while using rolling walker. Stair training using single rail and patient required min assist, having moderate difficulty with this task. Will follow up in AM for additional stair training and to answer any further questions prior to d/c. Patient will continue to benefit from skilled physical therapy services to further improve independence with functional mobility.   Follow Up Recommendations  Home health PT;Supervision for mobility/OOB     Equipment Recommendations       Recommendations for Other Services OT consult     Precautions / Restrictions Precautions Precautions: None Precaution Comments: Direct anterior hip replacement - no precautions Restrictions Weight Bearing Restrictions: Yes RLE Weight Bearing: Weight bearing as tolerated    Mobility  Bed Mobility Overal bed mobility: Needs Assistance Bed Mobility: Sit to Supine       Sit to supine: Supervision   General bed mobility comments: good trunk control, pt requires extra time for LEs  Transfers Overall transfer level: Needs assistance Equipment used: Rolling walker (2 wheeled) Transfers: Sit to/from Stand Sit to Stand: Min guard         General transfer comment: Min guard for safety from recliner x2. No physical assist needed from this surface. VCs for hand placement  Ambulation/Gait Ambulation/Gait assistance: Supervision Ambulation Distance (Feet): 55 Feet Assistive device: Rolling walker (2 wheeled) Gait Pattern/deviations: Step-to pattern;Decreased step length - left;Decreased stance time - right;Antalgic;Trunk flexed   Gait velocity  interpretation: Below normal speed for age/gender General Gait Details: Slowly improving mechanics. VCs for walker placement. Educated to increase WB through RLE as UEs fatigue easily due to overuse.   Stairs Stairs: Yes Stairs assistance: Min assist Stair Management: One rail Right;Step to pattern;Sideways Number of Stairs: 2 General stair comments: Min assist for support with ascend/descending stairs. Heavy use of rail and leans over if not cued to correct. Difficulty ascending with clearning foot safely. Will need further training prior to d/c.  Wheelchair Mobility    Modified Rankin (Stroke Patients Only)       Balance Overall balance assessment: Needs assistance Sitting-balance support: No upper extremity supported;Feet supported Sitting balance-Leahy Scale: Good     Standing balance support: Single extremity supported Standing balance-Leahy Scale: Poor                      Cognition Arousal/Alertness: Awake/alert Behavior During Therapy: WFL for tasks assessed/performed Overall Cognitive Status: Within Functional Limits for tasks assessed                      Exercises      General Comments        Pertinent Vitals/Pain Pt reports pain as moderate this afternoon Patient repositioned in chair for comfort. Hand-off with OT for OT session    Home Living                      Prior Function            PT Goals (current goals can now be found in the care plan section) Acute Rehab PT Goals Patient Stated Goal: Feel better PT Goal Formulation: With patient Time For  Goal Achievement: 11/25/13 Potential to Achieve Goals: Good Progress towards PT goals: Progressing toward goals    Frequency  7X/week    PT Plan Current plan remains appropriate    Co-evaluation             End of Session   Activity Tolerance: Patient tolerated treatment well Patient left: in chair;Other (comment) (hand-off to OT in hallway for OT session)      Time: 1412-1430 PT Time Calculation (min): 18 min  Charges:  $Gait Training: 8-22 mins                    G Codes:      Charlsie MerlesLogan Secor Amorah [place], [place] CarolinaPT [phone]   Berton MountBarbour, Tyeesha Riker S 11/19/2013, 4:05 PM

## 2013-11-19 NOTE — Progress Notes (Signed)
Subjective: 2 Days Post-Op Procedure(s) (LRB): RIGHT TOTAL HIP ARTHROPLASTY ANTERIOR APPROACH (Right) Patient reports pain as moderate.  Working slowly with therapy.  Objective: Vital signs in last 24 hours: Temp:  [98.4 F (36.9 C)-100.5 F (38.1 C)] 100.5 F (38.1 C) (06/25 0508) Pulse Rate:  [54-74] 74 (06/25 0508) Resp:  [15-16] 16 (06/25 0508) BP: (102-129)/(55-67) 124/62 mmHg (06/25 0508) SpO2:  [96 %-100 %] 100 % (06/25 0508)  Intake/Output from previous day: 06/24 0701 - 06/25 0700 In: 1320 [P.O.:720; I.V.:600] Out: -  Intake/Output this shift:     Recent Labs  11/18/13 0505 11/19/13 0450  HGB 9.6* 9.3*    Recent Labs  11/18/13 0505 11/19/13 0450  WBC 7.7 8.9  RBC 3.30* 3.10*  HCT 30.7* 29.6*  PLT 163 153    Recent Labs  11/18/13 0505  NA 141  K 3.7  CL 100  CO2 29  BUN 19  CREATININE 1.00  GLUCOSE 113*  CALCIUM 8.6   No results found for this basename: LABPT, INR,  in the last 72 hours  Sensation intact distally Intact pulses distally Dorsiflexion/Plantar flexion intact Incision: dressing C/D/I  Assessment/Plan: 2 Days Post-Op Procedure(s) (LRB): RIGHT TOTAL HIP ARTHROPLASTY ANTERIOR APPROACH (Right) Plan for discharge tomorrow  Kathryne HitchBLACKMAN,CHRISTOPHER Y 11/19/2013, 6:56 AM

## 2013-11-20 LAB — CBC
HCT: 25.3 % — ABNORMAL LOW (ref 36.0–46.0)
Hemoglobin: 8 g/dL — ABNORMAL LOW (ref 12.0–15.0)
MCH: 29.4 pg (ref 26.0–34.0)
MCHC: 31.6 g/dL (ref 30.0–36.0)
MCV: 93 fL (ref 78.0–100.0)
Platelets: 153 10*3/uL (ref 150–400)
RBC: 2.72 MIL/uL — ABNORMAL LOW (ref 3.87–5.11)
RDW: 14.5 % (ref 11.5–15.5)
WBC: 6.7 10*3/uL (ref 4.0–10.5)

## 2013-11-20 MED ORDER — MORPHINE SULFATE 30 MG PO TABS: 30.0000 mg | ORAL_TABLET | ORAL | Status: AC | PRN

## 2013-11-20 MED ORDER — FERROUS SULFATE 325 (65 FE) MG PO TABS
325.0000 mg | ORAL_TABLET | Freq: Three times a day (TID) | ORAL | Status: AC
Start: 2013-11-20 — End: ?

## 2013-11-20 MED ORDER — OXYCODONE HCL 10 MG PO TABS: 10.0000 mg | ORAL_TABLET | Freq: Three times a day (TID) | ORAL | Status: AC | PRN

## 2013-11-20 MED ORDER — TIZANIDINE HCL 4 MG PO TABS: 4.0000 mg | ORAL_TABLET | Freq: Three times a day (TID) | ORAL | Status: AC | PRN

## 2013-11-20 NOTE — Progress Notes (Signed)
Subjective: 3 Days Post-Op Procedure(s) (LRB): RIGHT TOTAL HIP ARTHROPLASTY ANTERIOR APPROACH (Right) Patient reports pain as moderate.  Hgb down to 8.0, but asymptomatic.  Objective: Vital signs in last 24 hours: Temp:  [98.7 F (37.1 C)-98.8 F (37.1 C)] 98.8 F (37.1 C) (06/25 2107) Pulse Rate:  [61-75] 61 (06/25 2107) Resp:  [16-18] 16 (06/25 2107) BP: (108-119)/(54-56) 119/54 mmHg (06/25 2107) SpO2:  [92 %-100 %] 100 % (06/25 2107)  Intake/Output from previous day: 06/25 0701 - 06/26 0700 In: 120 [P.O.:120] Out: -  Intake/Output this shift: Total I/O In: 120 [P.O.:120] Out: -    Recent Labs  11/18/13 0505 11/19/13 0450 11/20/13 0500  HGB 9.6* 9.3* 8.0*    Recent Labs  11/19/13 0450 11/20/13 0500  WBC 8.9 6.7  RBC 3.10* 2.72*  HCT 29.6* 25.3*  PLT 153 153    Recent Labs  11/18/13 0505  NA 141  K 3.7  CL 100  CO2 29  BUN 19  CREATININE 1.00  GLUCOSE 113*  CALCIUM 8.6   No results found for this basename: LABPT, INR,  in the last 72 hours  Sensation intact distally Intact pulses distally Dorsiflexion/Plantar flexion intact Incision: dressing C/D/I  Assessment/Plan: 3 Days Post-Op Procedure(s) (LRB): RIGHT TOTAL HIP ARTHROPLASTY ANTERIOR APPROACH (Right) Discharge home with home health  BLACKMAN,CHRISTOPHER Y 11/20/2013, 7:00 AM

## 2013-11-20 NOTE — Discharge Summary (Signed)
Patient ID: Autumn Lopez MRN: [id number] DOB/AGE: 66-21-1947 66 y.o.  Admit date: 11/17/2013 Discharge date: 11/20/2013  Admission Diagnoses:  Principal Problem:   Arthritis of right hip Active Problems:   Status post THR (total hip replacement)   Discharge Diagnoses:  Same  Past Medical History  Diagnosis Date  . Hypertension   . GERD (gastroesophageal reflux disease)   . Arthritis     "hips" (07/29/2013)  . Headache(784.0)     Surgeries: Procedure(s): RIGHT TOTAL HIP ARTHROPLASTY ANTERIOR APPROACH on 11/17/2013   Consultants:    Discharged Condition: Improved  Hospital Course: Autumn Lopez is an 68 y.o. female who was admitted 11/17/2013 for operative treatment ofArthritis of right hip. Patient has severe unremitting pain that affects sleep, daily activities, and work/hobbies. After pre-op clearance the patient was taken to the operating room on 11/17/2013 and underwent  Procedure(s): RIGHT TOTAL HIP ARTHROPLASTY ANTERIOR APPROACH.    Patient was given perioperative antibiotics: Anti-infectives   Start     Dose/Rate Route Frequency Ordered Stop   11/17/13 2000  ceFAZolin (ANCEF) IVPB 1 g/50 mL premix     1 g 100 mL/hr over 30 Minutes Intravenous Every 6 hours 11/17/13 1646 11/18/13 0411   11/17/13 0600  ceFAZolin (ANCEF) IVPB 2 g/50 mL premix     2 g 100 mL/hr over 30 Minutes Intravenous On call to O.R. 11/16/13 1242 11/17/13 1345       Patient was given sequential compression devices, early ambulation, and chemoprophylaxis to prevent DVT.  Patient benefited maximally from hospital stay and there were no complications.    Recent vital signs: Patient Vitals for the past 24 hrs:  BP Temp Temp src Pulse Resp SpO2  11/19/13 2107 119/54 mmHg 98.8 F (37.1 C) Oral 61 16 100 %  11/19/13 2000 - - - - 18 96 %  11/19/13 1411 114/54 mmHg 98.7 F (37.1 C) Oral 75 16 92 %  11/19/13 1000 108/56 mmHg - - - - -     Recent laboratory studies:  Recent Labs   11/18/13 0505 11/19/13 0450 11/20/13 0500  WBC 7.7 8.9 6.7  HGB 9.6* 9.3* 8.0*  HCT 30.7* 29.6* 25.3*  PLT 163 153 153  NA 141  --   --   K 3.7  --   --   CL 100  --   --   CO2 29  --   --   BUN 19  --   --   CREATININE 1.00  --   --   GLUCOSE 113*  --   --   CALCIUM 8.6  --   --      Discharge Medications:     Medication List         amLODipine 10 MG tablet  Commonly known as:  NORVASC  Take 10 mg by mouth daily.     aspirin 325 MG EC tablet  Take 1 tablet (325 mg total) by mouth 2 (two) times daily after a meal.     atenolol 50 MG tablet  Commonly known as:  TENORMIN  Take 50 mg by mouth daily.     ferrous sulfate 325 (65 FE) MG tablet  Commonly known as:  FERROUSUL  Take 1 tablet (325 mg total) by mouth 3 (three) times daily with meals.     lisinopril 40 MG tablet  Commonly known as:  PRINIVIL,ZESTRIL  Take 40 mg by mouth daily.     morphine 30 MG tablet  Commonly known as:  MSIR  Take  1 tablet (30 mg total) by mouth every 4 (four) hours as needed for severe pain.     omeprazole 20 MG capsule  Commonly known as:  PRILOSEC  Take 20 mg by mouth daily.     Oxycodone HCl 10 MG Tabs  Take 1 tablet (10 mg total) by mouth every 8 (eight) hours as needed (for pain).     tiZANidine 4 MG tablet  Commonly known as:  ZANAFLEX  Take 1 tablet (4 mg total) by mouth every 8 (eight) hours as needed for muscle spasms.     trolamine salicylate 10 % cream  Commonly known as:  ASPERCREME  Apply 1 application topically as needed for muscle pain.     zolpidem 10 MG tablet  Commonly known as:  AMBIEN  Take 10 mg by mouth at bedtime.        Diagnostic Studies: Dg Hip Operative Right  11/17/2013   CLINICAL DATA:  Right hip replacement.  EXAM: DG OPERATIVE RIGHT HIP  TECHNIQUE: Spot fluoroscopic AP images of the right hip is submitted.  COMPARISON:  None.  FINDINGS: Patient is status post right hip replacement. Good anatomic alignment on AP view.  IMPRESSION: Good  anatomic alignment on AP view post right hip replacement.   Electronically Signed   By: Maisie Fushomas  Register   On: 11/17/2013 14:57   Dg Pelvis Portable  11/17/2013   CLINICAL DATA:  Post RIGHT hip surgery  EXAM: PORTABLE PELVIS 1-2 VIEWS  COMPARISON:  Portable exam 1619 hr compared to 07/28/2013  FINDINGS: Interval placement of RIGHT hip prosthesis with acetabular and femoral components newly identified.  No acute fracture dislocation.  Bones demineralized.  LEFT hip prosthesis unchanged.  Superior pelvis excluded.  IMPRESSION: RIGHT hip prosthesis without acute radiographic complication.   Electronically Signed   By: Ulyses SouthwardMark  Boles M.D.   On: 11/17/2013 16:39   Dg Hip Portable 1 View Right  11/17/2013   CLINICAL DATA:  Right hip osteoarthritis.  EXAM: PORTABLE RIGHT HIP - 1 VIEW  COMPARISON:  Multiple priors.  FINDINGS: Cross-table lateral radiograph demonstrates satisfactory position alignment of the femoral and acetabular components.  IMPRESSION: As above.   Electronically Signed   By: Davonna BellingJohn  Curnes M.D.   On: 11/17/2013 16:36    Disposition: 06-Home-Health Care Svc      Discharge Instructions   Call MD / Call 911    Complete by:  As directed   If you experience chest pain or shortness of breath, CALL 911 and be transported to the hospital emergency room.  If you develope a fever above 101 F, pus (white drainage) or increased drainage or redness at the wound, or calf pain, call your surgeon's office.     Constipation Prevention    Complete by:  As directed   Drink plenty of fluids.  Prune juice may be helpful.  You may use a stool softener, such as Colace (over the counter) 100 mg twice a day.  Use MiraLax (over the counter) for constipation as needed.     Diet - low sodium heart healthy    Complete by:  As directed      Discharge instructions    Complete by:  As directed   Increase your activities slowly. Full weight as tolerated right hip; no hip precautions. You can get your actual dressing  wet in the shower. You can remove your dressing 11/24/13 and start getting your actual incision wet daily; then new dry dressing daily. Expect thigh swellingg and knee pain.  Discharge patient    Complete by:  As directed      Increase activity slowly as tolerated    Complete by:  As directed            Follow-up Information   Follow up with [facility]. (Someone from [facility] will contact you concerning start date and time for physical therapy.)    Contact information:   [address] [place] [address] [phone]       Follow up with Kathryne Hitch, MD In 2 weeks.   Specialty:  Orthopedic Surgery   Contact information:   [address] [place] [address] [phone]        Signed: Kathryne Hitch 11/20/2013, 7:02 AM

## 2013-11-20 NOTE — Progress Notes (Signed)
Patient discharged to home accompanied by family. Discharge instructions and rx given and explained and patient stated understanding. IV was removed and patient left unit in a stable condition with all personal belongings via wheelchair.  

## 2013-11-20 NOTE — Progress Notes (Signed)
Physical Therapy Treatment Patient Details Name: Autumn Lopez MRN: [id number] DOB: [birth year] Today's Date: 11/20/2013    History of Present Illness 68 y.o. female s/p right total hip arthroplasty. Hx of left THA (2015), HTN and GERD.    PT Comments    Patient is progressing well towards physical therapy goals, ambulating up to 75 feet with supervision. Completed stair training this AM with no difficulties and pt reports she feels safe with this task. All pertinent education has been reviewed and pt has no further questions for PT at this time. Patient will continue to benefit from skilled physical therapy services at home to further improve independence with functional mobility. She is adequate for d/c from PT standpoint.     Follow Up Recommendations  Home health PT;Supervision for mobility/OOB     Equipment Recommendations  None recommended by PT    Recommendations for Other Services OT consult     Precautions / Restrictions Precautions Precautions: None Precaution Comments: Direct anterior hip replacement - no precautions Restrictions Weight Bearing Restrictions: Yes RLE Weight Bearing: Weight bearing as tolerated    Mobility  Bed Mobility                  Transfers Overall transfer level: Modified independent Equipment used: Rolling walker (2 wheeled)                Ambulation/Gait Ambulation/Gait assistance: Supervision Ambulation Distance (Feet): 75 Feet Assistive device: Rolling walker (2 wheeled) Gait Pattern/deviations: Step-to pattern;Decreased step length - left;Decreased stance time - right;Antalgic;Trunk flexed   Gait velocity interpretation: Below normal speed for age/gender General Gait Details: Intermittent VCs for upright posture. No loss of balance. Demonstrating good walker control.    Stairs Stairs: Yes Stairs assistance: Min assist Stair Management: No rails;Step to pattern;Backwards;With walker   General stair comments:  Min assist to block RW only. Demonstrated to patient prior to having her practice and she was able to teach back sequencing correctly. Pt reports she feels safer with backwards technique compared to sideways technique practiced yesterday. Pt has no further questions concerning this task.  Wheelchair Mobility    Modified Rankin (Stroke Patients Only)       Balance                                    Cognition Arousal/Alertness: Awake/alert Behavior During Therapy: WFL for tasks assessed/performed Overall Cognitive Status: Within Functional Limits for tasks assessed                      Exercises Total Joint Exercises Ankle Circles/Pumps: AROM;Both;10 reps;Seated Quad Sets: AROM;Both;10 reps;Seated Long Arc Quad: AROM;Right;10 reps;Seated    General Comments        Pertinent Vitals/Pain 5/10 pain Nurse aware, pt declined pain medication Patient repositioned in chair for comfort.     Home Living                      Prior Function            PT Goals (current goals can now be found in the care plan section) Acute Rehab PT Goals PT Goal Formulation: With patient Time For Goal Achievement: 11/25/13 Potential to Achieve Goals: Good Progress towards PT goals: Progressing toward goals    Frequency  7X/week    PT Plan Current plan remains appropriate    Co-evaluation  End of Session   Activity Tolerance: Patient tolerated treatment well Patient left: in chair;with call bell/phone within reach     Time: 0912-0938 PT Time Calculation (min): 26 min  Charges:  $Gait Training: 8-22 mins $Therapeutic Exercise: 8-22 mins                    G Codes:      BJ's WholesaleLogan Secor [place], [place]PT [phone]  Berton MountBarbour, Logan S 11/20/2013, 10:31 AM

## 2015-02-26 IMAGING — CR DG CHEST 2V
2 series · 2 of 2 positions shown · non-contrast
Comparison: None

CLINICAL DATA: Preoperative evaluation for hip replacement, history
hypertension

EXAM:
CHEST  2 VIEW

[w chest pa]
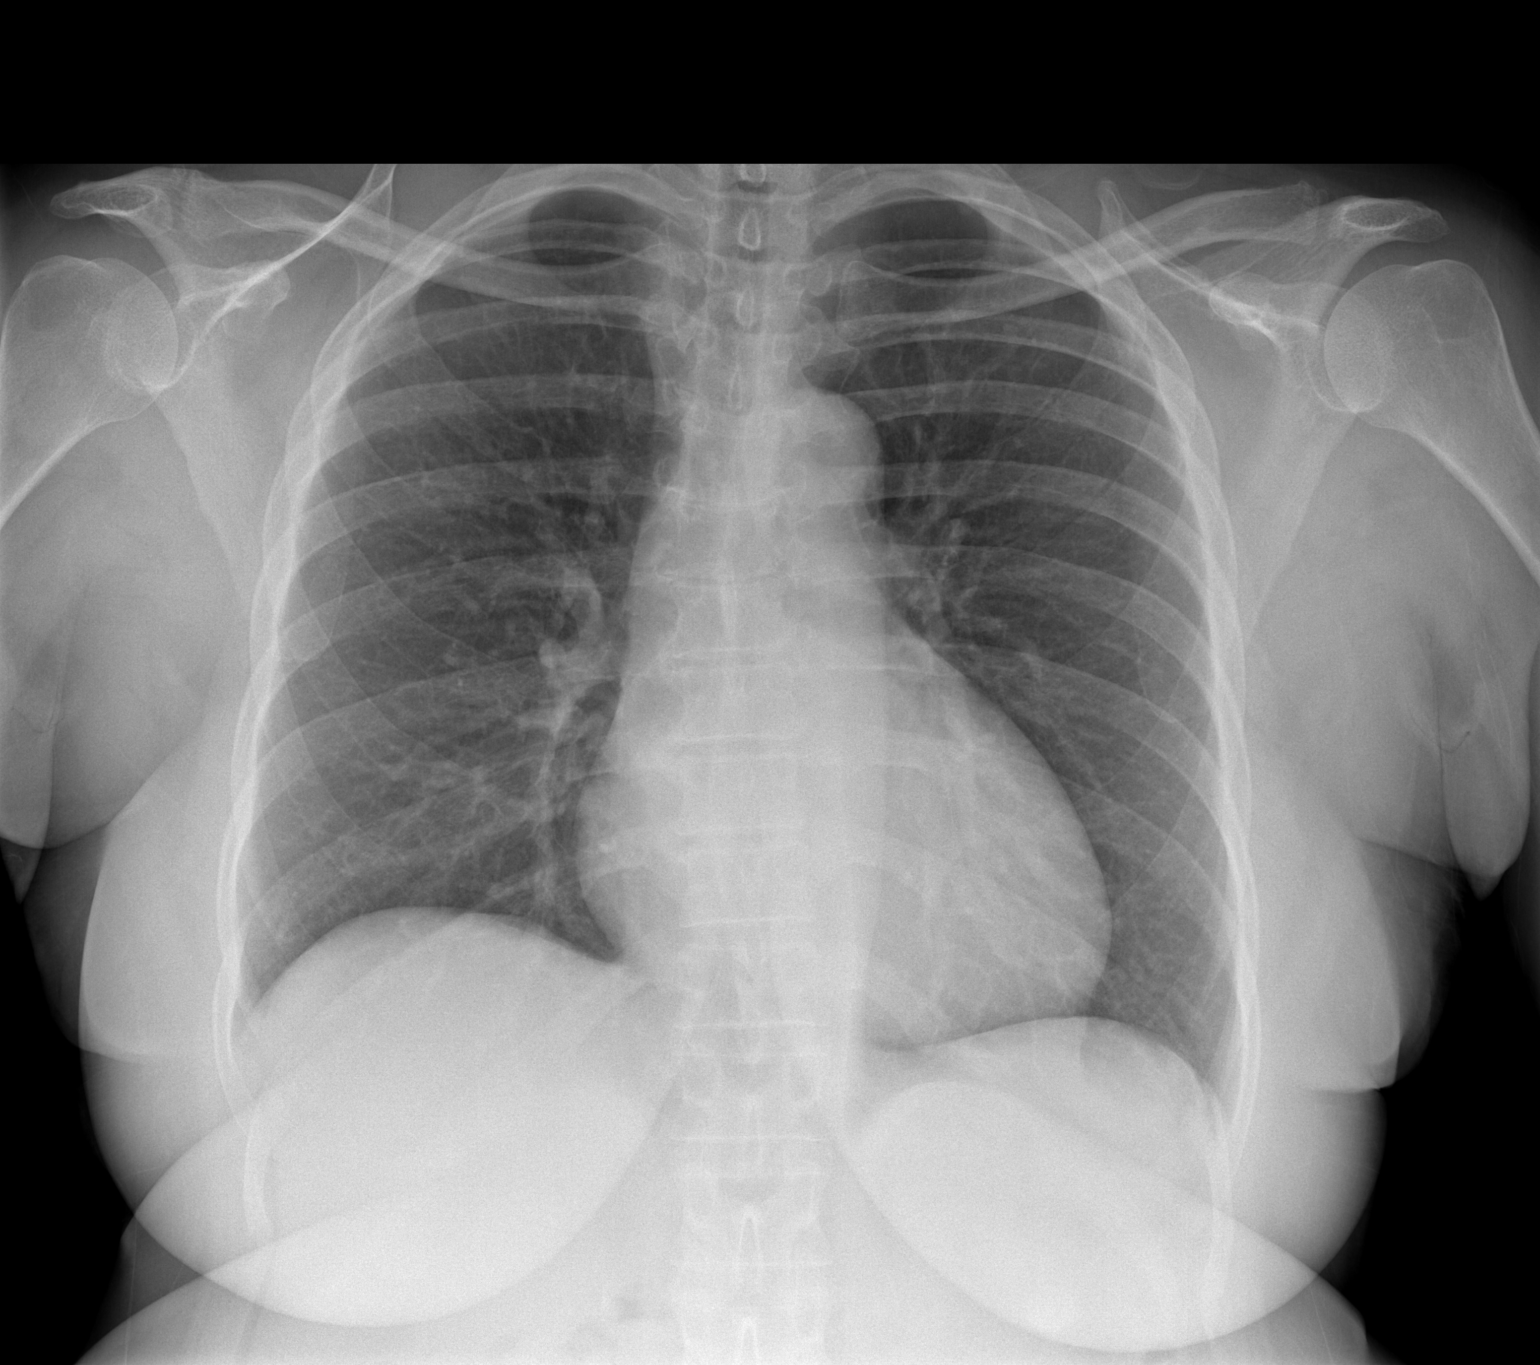

[w chest lat]
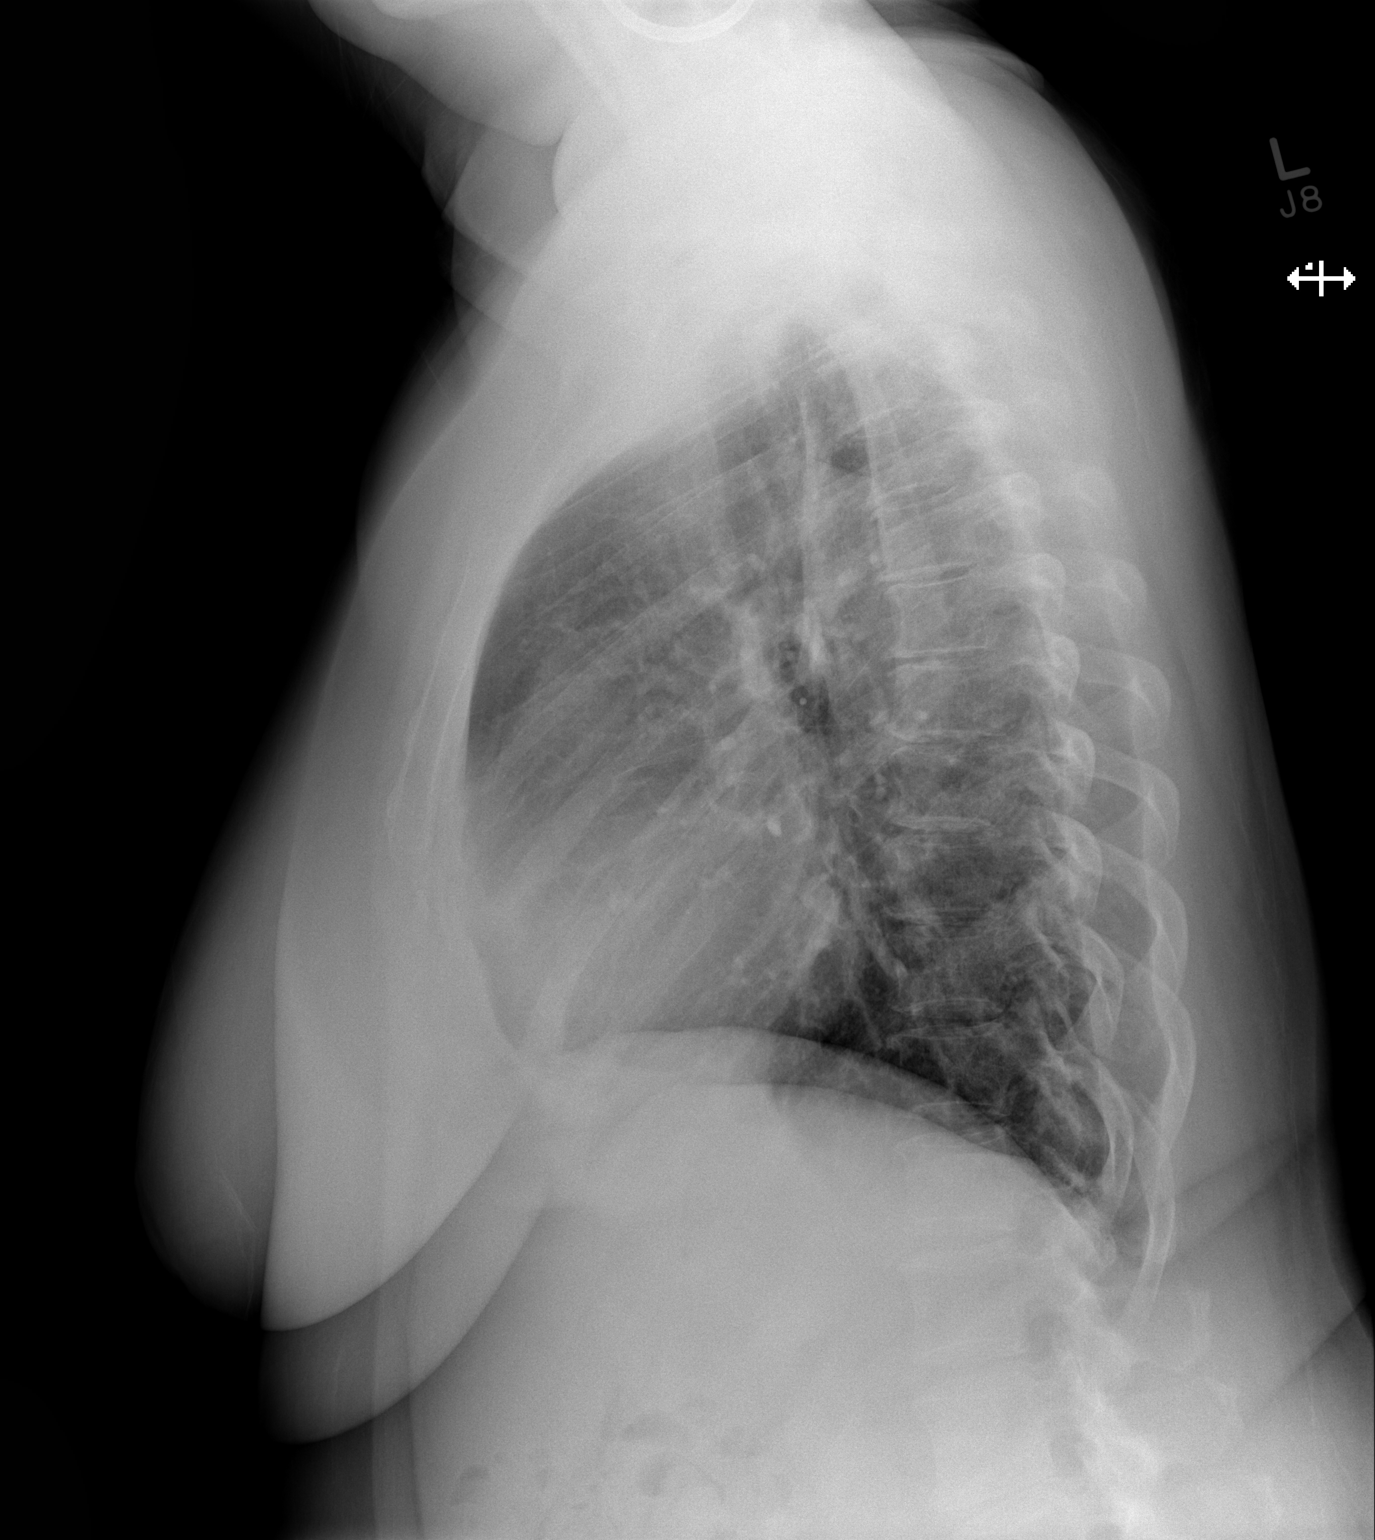

[2 of 2 positions shown; findings below may reference images not displayed]

FINDINGS: Minimal enlargement of cardiac silhouette.

Mediastinal contours and pulmonary vascularity normal.

Lungs clear.

No pleural effusion or pneumothorax.

Bones unremarkable.
IMPRESSION: Minimal enlargement of cardiac silhouette.

No acute abnormalities.

## 2015-03-06 IMAGING — CR DG PORTABLE PELVIS
1 series · 1 of 1 positions shown · non-contrast
Comparison: Intraoperative images same day.

CLINICAL DATA: Postop left hip.

EXAM:
PORTABLE PELVIS 1-2 VIEWS

[AP]
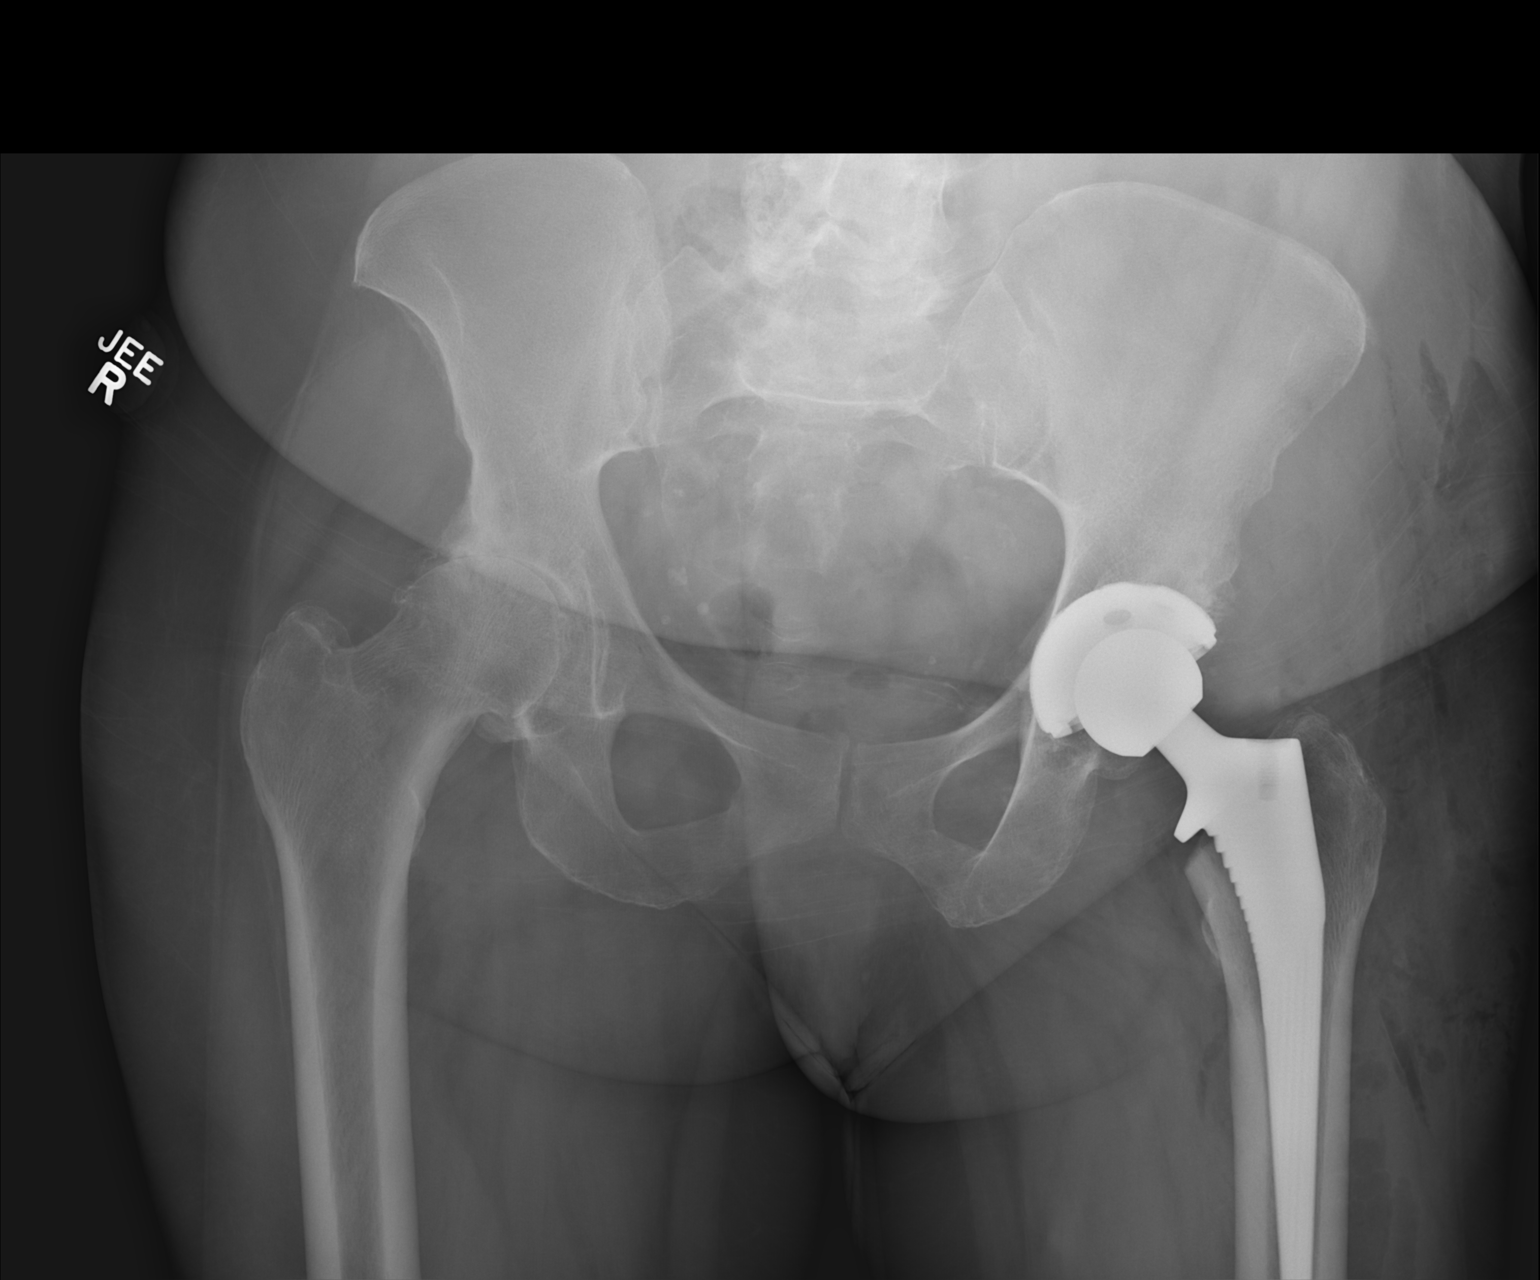

[1 of 1 positions shown; findings below may reference images not displayed]

FINDINGS: There has been placement of a left total hip arthroplasty which is
intact and normally located. There are moderate osteoarthritic
changes of the right hip. There are degenerative changes of the
spine. Several phleboliths are present over the pelvis.
IMPRESSION: Left total hip arthroplasty intact and normally located.

Moderate osteoarthritis of the right hip.

## 2015-06-26 IMAGING — RF DG HIP OPERATIVE*R*
1 series · 3 of 3 positions shown · non-contrast
Comparison: None.

CLINICAL DATA: Right hip replacement.

EXAM:
DG OPERATIVE RIGHT HIP
TECHNIQUE: Spot fluoroscopic AP images of the right hip is submitted.

[Series 1: run · 3 of 3 slices shown]
[im 1/3]
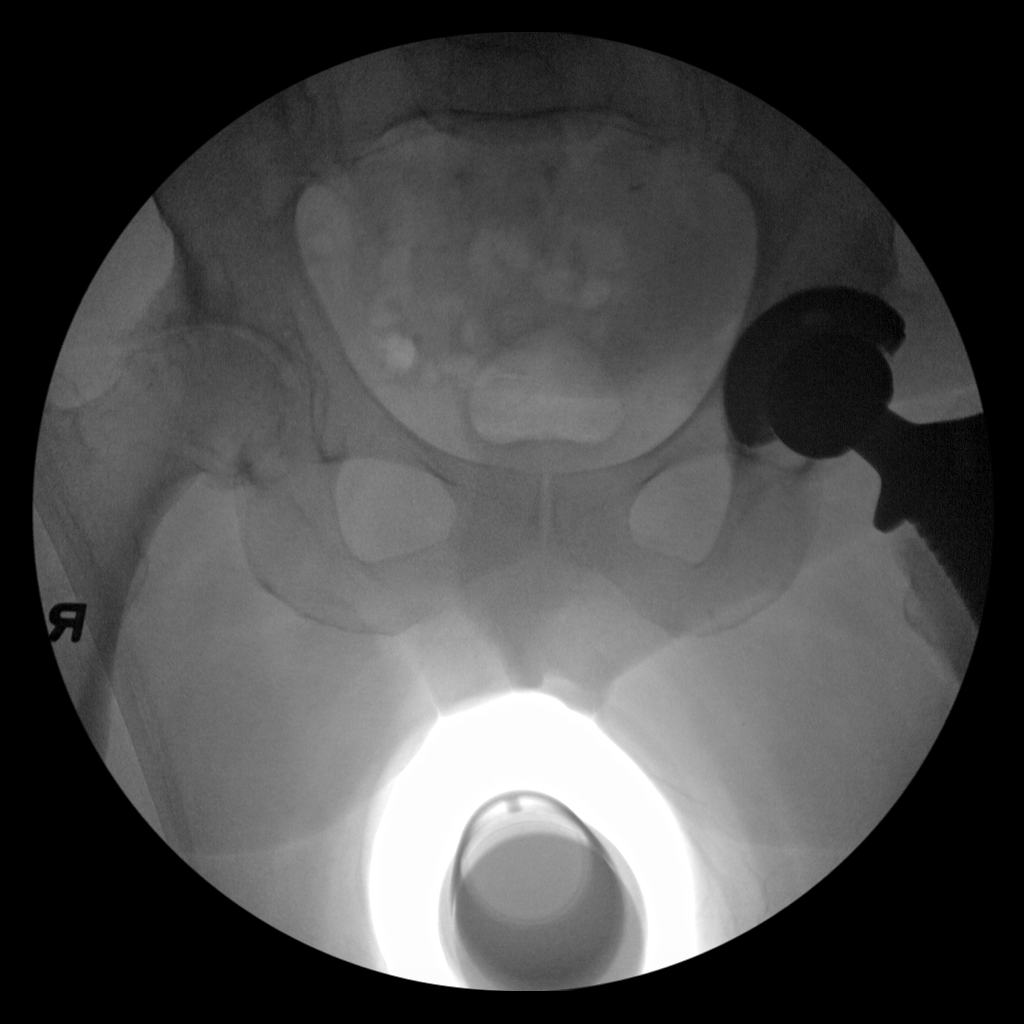
[im 2/3]
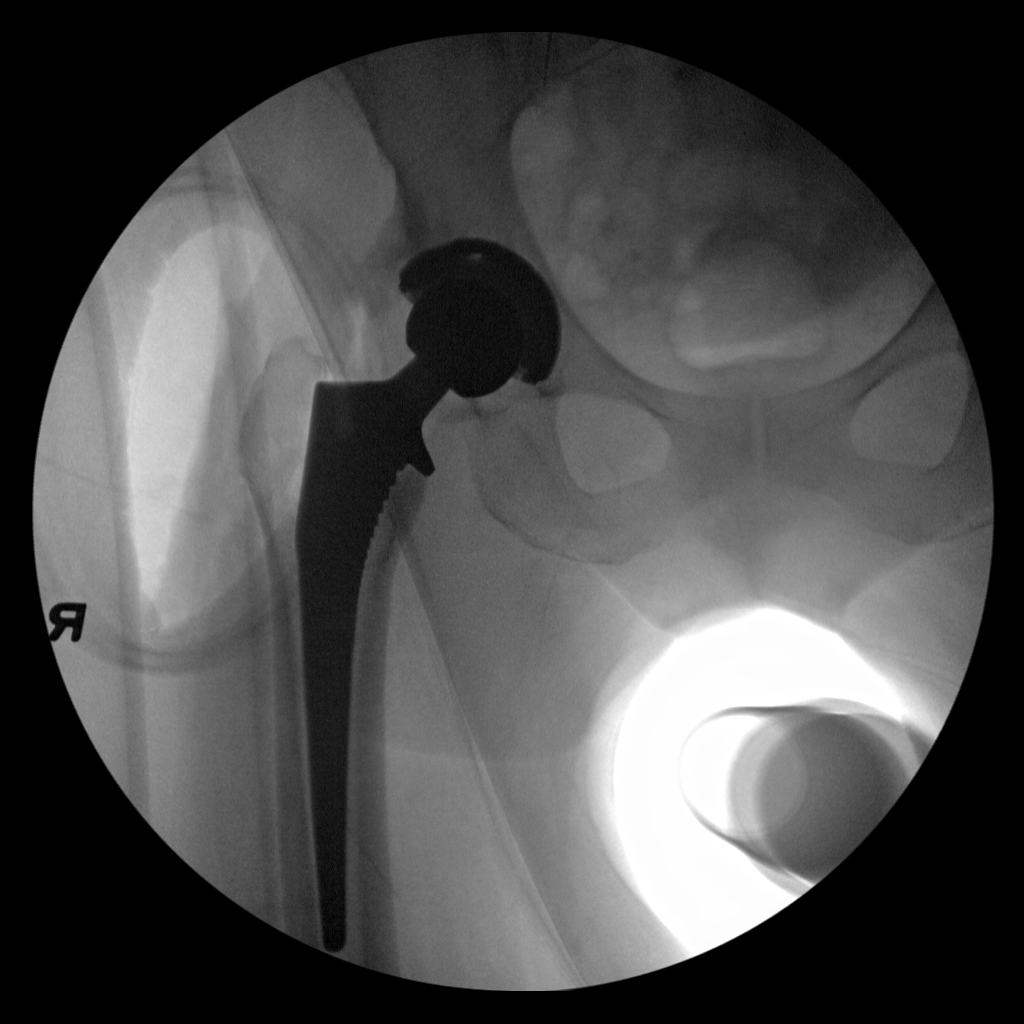
[im 3/3]
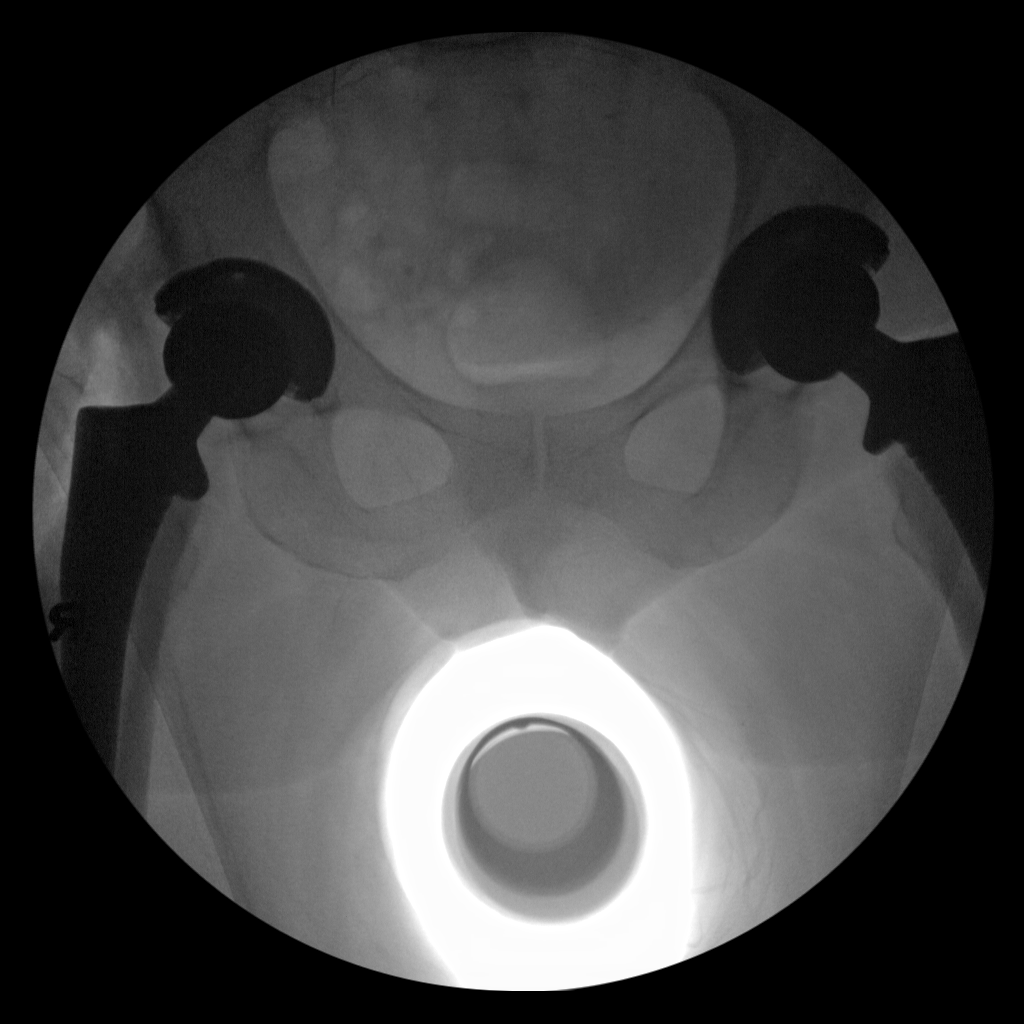

[3 of 3 positions shown; findings below may reference images not displayed]

FINDINGS: Patient is status post right hip replacement. Good anatomic
alignment on AP view.
IMPRESSION: Good anatomic alignment on AP view post right hip replacement.

## 2021-05-31 ENCOUNTER — Telehealth: Payer: Self-pay | Admitting: Orthopaedic Surgery

## 2021-05-31 NOTE — Telephone Encounter (Signed)
Pts dentist called in stating she has an appt next week and wanted to make sure she didn't need any antibiotics. Pt had surgery in 2015 so I let the dentist office know Dr.Blackman requires an antibiotic for 3 months post op. The dentist office would like to have [place] pre dental protocal emailed over so they can add it to the pts chart.   [email or web address]

## 2021-05-31 NOTE — Telephone Encounter (Signed)
Emailed

## 2022-11-05 ENCOUNTER — Ambulatory Visit
Admission: EM | Admit: 2022-11-05 | Discharge: 2022-11-05 | Disposition: A | Discharge status: Home / Self Care | Payer: Medicare HMO | Attending: Nurse Practitioner | Admitting: Nurse Practitioner

## 2022-11-05 DIAGNOSIS — R079 Chest pain, unspecified: Secondary | ICD-10-CM | POA: Diagnosis not present

## 2022-11-05 NOTE — ED Notes (Addendum)
Patient is being discharged from the Urgent Care and sent to the Emergency Department via EMS ambulance. Per Cheri Rous, NP, patient is in need of higher level of care due to chest pain and shortness of breath. Patient is aware and verbalizes understanding of plan of care.  Vitals:   11/05/22 1002 11/05/22 1004  BP:  (!) 191/102  Pulse: 61   Resp: 16   Temp: 98.5 F (36.9 C)   SpO2: 94%

## 2022-11-05 NOTE — Discharge Instructions (Signed)
Patient taken to emergency room via EMS

## 2022-11-05 NOTE — ED Triage Notes (Signed)
Pt presents with c/o random intermittent sharp pains to the chest, has felt out of breath when walking X 2 wks.  Has had headaches.  Denies n/v/d.

## 2022-11-05 NOTE — ED Provider Notes (Signed)
[facility]    CSN: [id number] Arrival date & time: 11/05/22  0932      History   Chief Complaint Chief Complaint  Patient presents with   Chest Pain   Shortness of Breath   Headache    HPI Autumn Lopez is a 77 y.o. female presents for evaluation of chest pain.  Patient reports 2 weeks of an intermittent left-sided sharp chest pain that does radiate through towards her back and is associated with shortness of breath, left arm pain, palpitations, dizziness, headache.  She denies any syncope.  She states pain is worse with activity and better with rest.  She does have a history of hypertension and hyperlipidemia.  Denies any smoking history.  She does follow with cardiology for palpitations and high blood pressure.  She did have an echocardiogram in December 2023 showing EF of 60 to 65% with moderate LVH with small wall motion abnormalities.  She states she had a stress test that was negative.  She reports a family history of hypertension but denies MI, CAD, CVA.   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      Home Medications    Prior to Admission medications   Medication Sig Start Date End Date Taking? Authorizing Provider  amLODipine (NORVASC) 10 MG tablet Take 10 mg by mouth daily.    [provider]  aspirin EC 325 MG EC tablet Take 1 tablet (325 mg total) by mouth 2 (two) times daily after a meal. 07/30/13   Kathryne Hitch, MD  atenolol (TENORMIN) 50 MG tablet Take 50 mg by mouth daily.    [provider]  ferrous sulfate (FERROUSUL) 325 (65 FE) MG tablet Take 1 tablet (325 mg total) by mouth 3 (three) times daily with meals. 11/20/13   Kathryne Hitch, MD  lisinopril (PRINIVIL,ZESTRIL) 40 MG tablet Take 40 mg by mouth daily.    [provider]  morphine (MSIR) 30 MG tablet Take 1 tablet (30 mg total) by mouth every 4 (four) hours as needed for severe pain. 11/20/13   Kathryne Hitch, MD  omeprazole (PRILOSEC) 20 MG capsule Take 20 mg by mouth daily.    [provider]  Oxycodone HCl 10 MG TABS Take 1 tablet (10 mg total) by mouth every 8 (eight) hours as needed (for pain). 11/20/13   Kathryne Hitch, MD  tiZANidine (ZANAFLEX) 4 MG tablet Take 1 tablet (4 mg total)  by mouth every 8 (eight) hours as needed for muscle spasms. 11/20/13   Kathryne Hitch, MD  trolamine salicylate (ASPERCREME) 10 % cream Apply 1 application topically as needed for muscle pain.     [provider]  zolpidem (AMBIEN) 10 MG tablet Take 10 mg by mouth at bedtime.    [provider]    Family History History reviewed. No pertinent family history.  Social History Social History   Tobacco Use   Smoking status: Never   Smokeless tobacco: Never  Substance Use Topics   Alcohol use: No   Drug use: No     Allergies   Tape   Review of Systems Review of Systems  Respiratory:  Positive for  shortness of breath.   Cardiovascular:  Positive for chest pain and palpitations.  Neurological:  Positive for headaches.     Physical Exam Triage Vital Signs ED Triage Vitals  Enc Vitals Group     BP 11/05/22 1004 (!) 191/102     Pulse Rate 11/05/22 1002 61     Resp 11/05/22 1002 16     Temp 11/05/22 1002 98.5 F (36.9 C)     Temp Source 11/05/22 1002 Oral     SpO2 11/05/22 1002 94 %     Weight --      Height --      Head Circumference --      Peak Flow --      Pain Score --      Pain Loc --      Pain Edu? --      Excl. in GC? --    No data found.  Updated Vital Signs BP (!) 191/102   Pulse 61   Temp 98.5 F (36.9 C) (Oral)   Resp 16   SpO2 94%   Visual Acuity Right Eye Distance:   Left Eye Distance:   Bilateral Distance:    Right Eye Near:   Left Eye Near:    Bilateral Near:     Physical Exam Vitals and nursing note reviewed.  Constitutional:      General: She is not in acute distress.    Appearance: Normal appearance. She is not ill-appearing, toxic-appearing or diaphoretic.  HENT:     Head: Normocephalic and atraumatic.  Eyes:     Pupils: Pupils are equal, round, and reactive to light.  Cardiovascular:     Rate and Rhythm: Normal rate and regular rhythm.     Heart sounds: Normal heart sounds.  Pulmonary:     Effort: Pulmonary effort is normal.     Breath sounds: Normal breath sounds.  Skin:    General: Skin is warm and dry.  Neurological:     General: No focal deficit present.     Mental Status: She is alert and oriented to person, place, and time.  Psychiatric:        Mood and Affect: Mood normal.        Behavior: Behavior normal.      UC Treatments / Results  Labs (all labs ordered are listed, but only abnormal results are displayed) Labs Reviewed - No data to display  EKG   Radiology No results found.  Procedures ED EKG  Date/Time: 11/05/2022 10:31 AM  Performed by: Radford Pax, NP Authorized by: Radford Pax, NP   ECG  interpreted by ED Physician in the absence of a cardiologist: no   Previous ECG:    Previous ECG:  Compared to current Interpretation:  Interpretation: abnormal   Rate:    ECG rate:  61   ECG rate assessment: normal   Rhythm:    Rhythm: sinus rhythm   Ectopy:    Ectopy: none   T waves:    T waves: inverted   Comments:     Sinus rhythm heart rate 61 with diffuse T wave inversions  (including critical care time)  Medications Ordered in UC Medications - No data to display  Initial Impression / Assessment and Plan / UC Course  I have reviewed the triage vital signs and the nursing notes.  Pertinent labs & imaging results that were available during my care of the patient were reviewed by me and considered in my medical decision making (see chart for details).     Reviewed exam and symptoms with patient.  Discussed limitations and abilities of urgent care.  Given her medical history, chest pain, and abnormal EKG advised EMS transfer to the emergency room for further evaluation.  Patient is in agreement with plan was taken via EMS to the ER. Final Clinical Impressions(s) / UC Diagnoses   Final diagnoses:  Chest pain, unspecified type   Discharge Instructions   None    ED Prescriptions   None    PDMP not reviewed this encounter.   Radford Pax, NP 11/05/22 1039
